# Patient Record
Sex: Female | Born: 1995 | Race: White | Hispanic: No | Marital: Single | Smoking: Never smoker
Health system: Southern US, Community
[De-identification: ages and names within clinical notes are randomized; demographics above are authoritative.]

## PROBLEM LIST (undated history)

## (undated) ENCOUNTER — Inpatient Hospital Stay (HOSPITAL_COMMUNITY): Payer: Self-pay

## (undated) DIAGNOSIS — J45909 Unspecified asthma, uncomplicated: Secondary | ICD-10-CM

## (undated) DIAGNOSIS — E8809 Other disorders of plasma-protein metabolism, not elsewhere classified: Secondary | ICD-10-CM

## (undated) DIAGNOSIS — I471 Supraventricular tachycardia, unspecified: Secondary | ICD-10-CM

## (undated) DIAGNOSIS — G43909 Migraine, unspecified, not intractable, without status migrainosus: Secondary | ICD-10-CM

## (undated) DIAGNOSIS — K589 Irritable bowel syndrome without diarrhea: Secondary | ICD-10-CM

## (undated) DIAGNOSIS — R51 Headache: Secondary | ICD-10-CM

## (undated) DIAGNOSIS — T7840XA Allergy, unspecified, initial encounter: Secondary | ICD-10-CM

## (undated) DIAGNOSIS — K219 Gastro-esophageal reflux disease without esophagitis: Secondary | ICD-10-CM

## (undated) DIAGNOSIS — F32A Depression, unspecified: Secondary | ICD-10-CM

## (undated) DIAGNOSIS — N035 Chronic nephritic syndrome with diffuse mesangiocapillary glomerulonephritis: Secondary | ICD-10-CM

## (undated) DIAGNOSIS — D649 Anemia, unspecified: Secondary | ICD-10-CM

## (undated) DIAGNOSIS — Z9109 Other allergy status, other than to drugs and biological substances: Secondary | ICD-10-CM

## (undated) DIAGNOSIS — F329 Major depressive disorder, single episode, unspecified: Secondary | ICD-10-CM

## (undated) DIAGNOSIS — N055 Unspecified nephritic syndrome with diffuse mesangiocapillary glomerulonephritis: Secondary | ICD-10-CM

## (undated) DIAGNOSIS — K635 Polyp of colon: Secondary | ICD-10-CM

## (undated) DIAGNOSIS — F419 Anxiety disorder, unspecified: Secondary | ICD-10-CM

## (undated) HISTORY — DX: Polyp of colon: K63.5

## (undated) HISTORY — DX: Anemia, unspecified: D64.9

## (undated) HISTORY — DX: Anxiety disorder, unspecified: F41.9

## (undated) HISTORY — PX: TONSILLECTOMY: SUR1361

## (undated) HISTORY — DX: Other disorders of plasma-protein metabolism, not elsewhere classified: E88.09

## (undated) HISTORY — PX: ESOPHAGOGASTRODUODENOSCOPY ENDOSCOPY: SHX5814

## (undated) HISTORY — DX: Unspecified asthma, uncomplicated: J45.909

## (undated) HISTORY — PX: RENAL BIOPSY: SHX156

## (undated) HISTORY — PX: COLONOSCOPY: SHX174

## (undated) HISTORY — DX: Allergy, unspecified, initial encounter: T78.40XA

## (undated) HISTORY — DX: Migraine, unspecified, not intractable, without status migrainosus: G43.909

## (undated) HISTORY — PX: TUBAL LIGATION: SHX77

## (undated) HISTORY — PX: ADENOIDECTOMY: SUR15

## (undated) HISTORY — DX: Supraventricular tachycardia, unspecified: I47.10

---
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
## 2014-06-19 ENCOUNTER — Encounter (HOSPITAL_COMMUNITY): Payer: Self-pay | Admitting: *Deleted

## 2014-08-15 IMAGING — CR DG CHEST 2V
2 series · 2 of 2 positions shown · non-contrast
Comparison: { 12/31/2005}

CHEST - 2 VIEW
CLINICAL DATA: { chest pain }

[w chest pa]
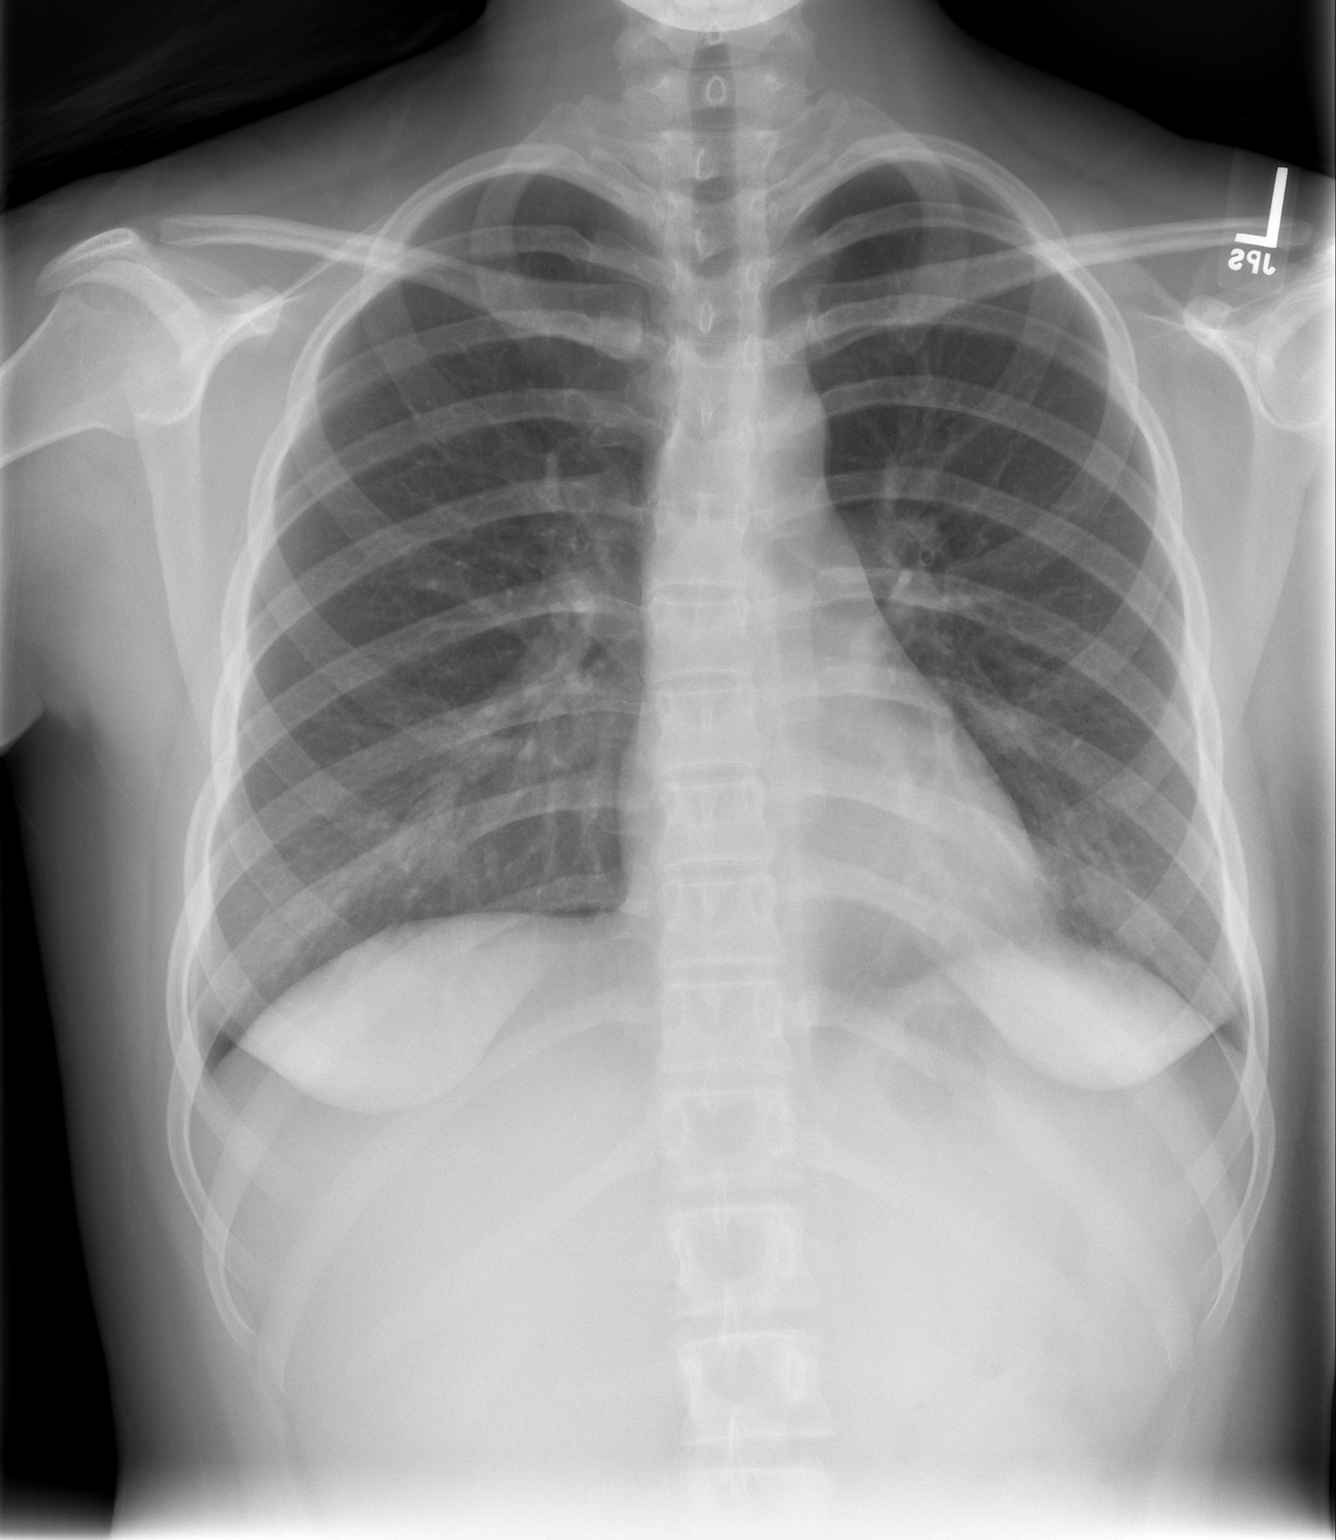

[w chest lat]
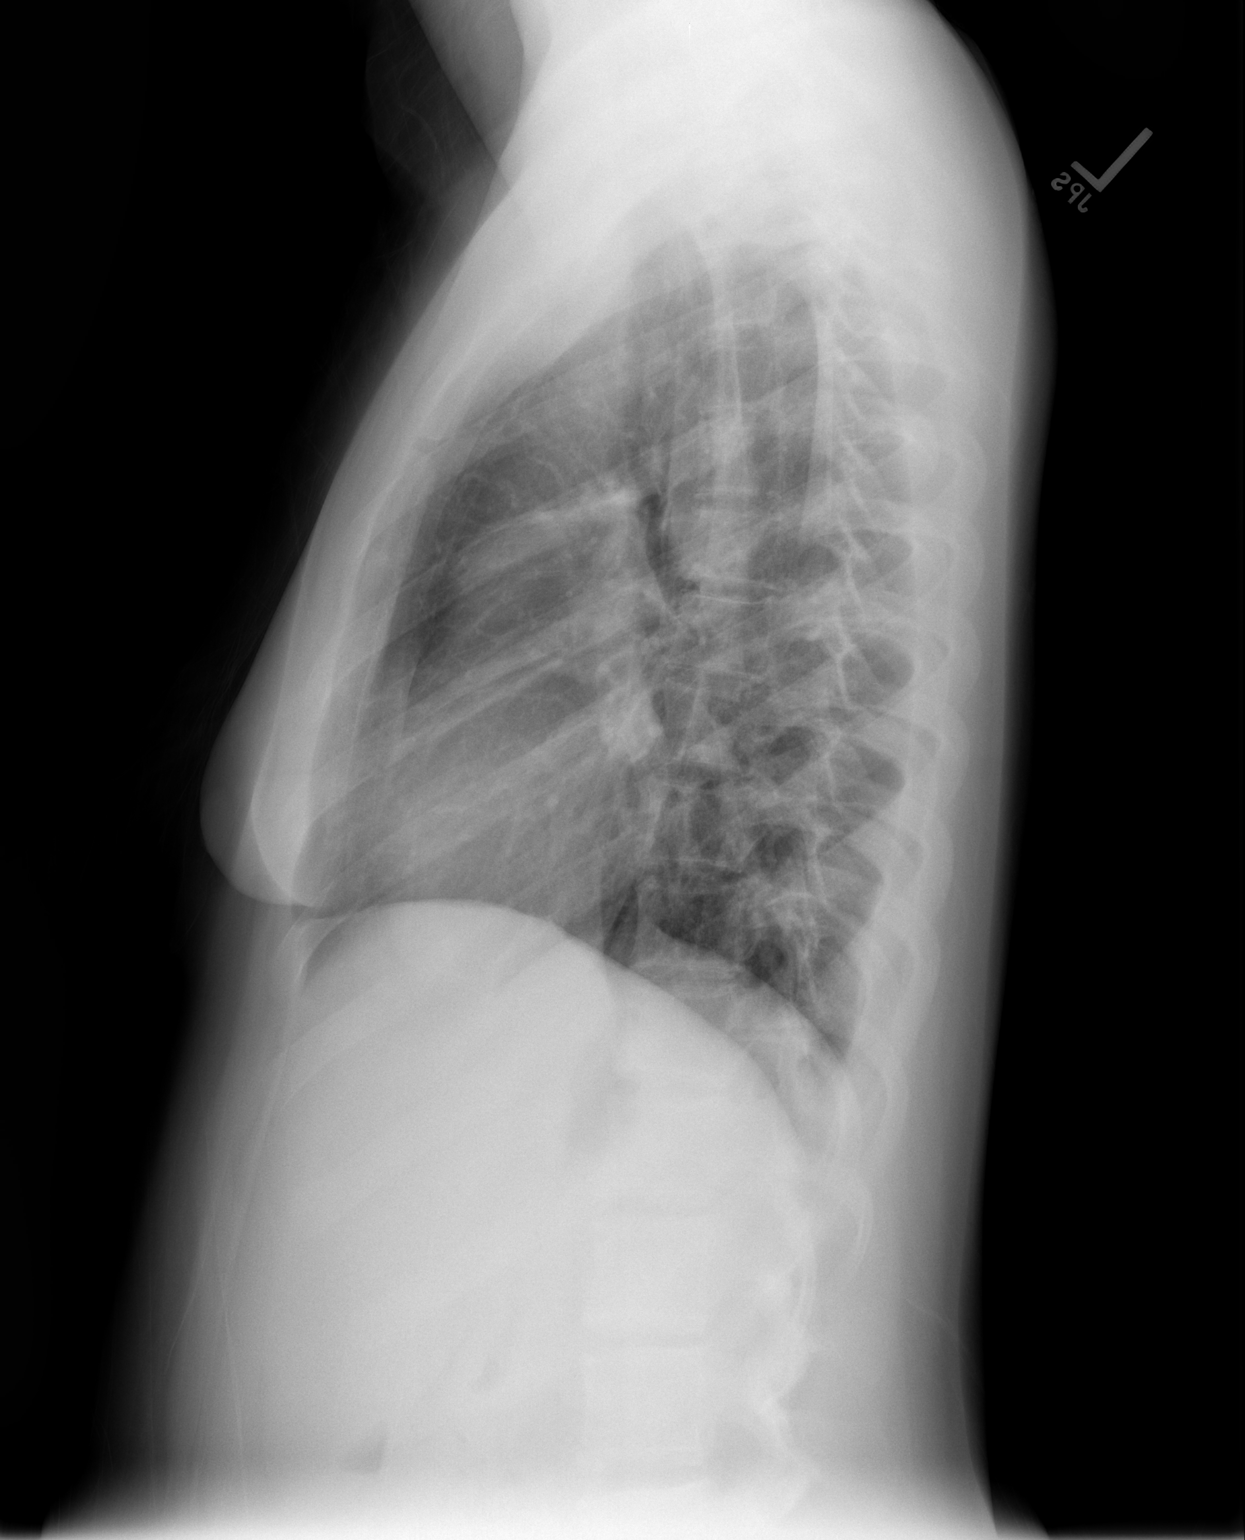

[2 of 2 positions shown; findings below may reference images not displayed]

FINDINGS: The heart, mediastinal, and hilar contours are normal.
The lungs are well-expanded and clear. Negative for pleural
abnormality or pneumothorax. No acute bony abnormality.
IMPRESSION: No acute cardiopulmonary disease.

## 2014-08-18 HISTORY — PX: ENDOMETRIAL ABLATION: SHX621

## 2015-10-02 ENCOUNTER — Encounter (HOSPITAL_COMMUNITY): Payer: Self-pay | Admitting: Emergency Medicine

## 2015-10-02 DIAGNOSIS — Z793 Long term (current) use of hormonal contraceptives: Secondary | ICD-10-CM | POA: Insufficient documentation

## 2015-10-02 DIAGNOSIS — K219 Gastro-esophageal reflux disease without esophagitis: Secondary | ICD-10-CM | POA: Diagnosis not present

## 2015-10-02 DIAGNOSIS — Z880 Allergy status to penicillin: Secondary | ICD-10-CM | POA: Diagnosis not present

## 2015-10-02 DIAGNOSIS — Z3202 Encounter for pregnancy test, result negative: Secondary | ICD-10-CM | POA: Diagnosis not present

## 2015-10-02 DIAGNOSIS — F329 Major depressive disorder, single episode, unspecified: Secondary | ICD-10-CM | POA: Diagnosis not present

## 2015-10-02 DIAGNOSIS — K2970 Gastritis, unspecified, without bleeding: Secondary | ICD-10-CM | POA: Diagnosis not present

## 2015-10-02 DIAGNOSIS — Z79899 Other long term (current) drug therapy: Secondary | ICD-10-CM | POA: Diagnosis not present

## 2015-10-02 DIAGNOSIS — Z87448 Personal history of other diseases of urinary system: Secondary | ICD-10-CM | POA: Insufficient documentation

## 2015-10-02 DIAGNOSIS — K920 Hematemesis: Secondary | ICD-10-CM | POA: Diagnosis present

## 2015-10-02 LAB — CBC
HCT: 39.5 % (ref 36.0–46.0)
HEMOGLOBIN: 12.9 g/dL (ref 12.0–15.0)
MCH: 27.3 pg (ref 26.0–34.0)
MCHC: 32.7 g/dL (ref 30.0–36.0)
MCV: 83.7 fL (ref 78.0–100.0)
Platelets: 253 10*3/uL (ref 150–400)
RBC: 4.72 MIL/uL (ref 3.87–5.11)
RDW: 13.3 % (ref 11.5–15.5)
WBC: 8.7 10*3/uL (ref 4.0–10.5)

## 2015-10-02 LAB — URINALYSIS, ROUTINE W REFLEX MICROSCOPIC
Bilirubin Urine: NEGATIVE
Glucose, UA: NEGATIVE mg/dL
Ketones, ur: NEGATIVE mg/dL
LEUKOCYTES UA: NEGATIVE
Nitrite: NEGATIVE
Protein, ur: >300 mg/dL — AB
SPECIFIC GRAVITY, URINE: 1.023 (ref 1.005–1.030)
pH: 6 (ref 5.0–8.0)

## 2015-10-02 LAB — COMPREHENSIVE METABOLIC PANEL
ALT: 15 U/L (ref 14–54)
ANION GAP: 8 (ref 5–15)
AST: 18 U/L (ref 15–41)
Albumin: 3 g/dL — ABNORMAL LOW (ref 3.5–5.0)
Alkaline Phosphatase: 44 U/L (ref 38–126)
BUN: 11 mg/dL (ref 6–20)
CALCIUM: 8.8 mg/dL — AB (ref 8.9–10.3)
CO2: 24 mmol/L (ref 22–32)
Chloride: 106 mmol/L (ref 101–111)
Creatinine, Ser: 0.57 mg/dL (ref 0.44–1.00)
GFR calc non Af Amer: >60 mL/min (ref >60)
Glucose, Bld: 101 mg/dL — ABNORMAL HIGH (ref 65–99)
Potassium: 3.8 mmol/L (ref 3.5–5.1)
Sodium: 138 mmol/L (ref 135–145)
TOTAL PROTEIN: 5.9 g/dL — AB (ref 6.5–8.1)
Total Bilirubin: 0.2 mg/dL — ABNORMAL LOW (ref 0.3–1.2)

## 2015-10-02 LAB — SAMPLE TO BLOOD BANK

## 2015-10-02 LAB — URINE MICROSCOPIC-ADD ON

## 2015-10-02 LAB — LIPASE, BLOOD: Lipase: 39 U/L (ref 11–51)

## 2015-10-02 LAB — POC URINE PREG, ED: Preg Test, Ur: NEGATIVE

## 2015-10-02 NOTE — ED Notes (Signed)
Pt. reports bloody emesis x1 this afternoon with generalized abdominal pain , denies fever or diarrhea , seen at [facility] urgent care this evening  advised to go to ER for evaluation .

## 2015-10-03 ENCOUNTER — Emergency Department (HOSPITAL_COMMUNITY)
Admission: EM | Admit: 2015-10-03 | Discharge: 2015-10-03 | Disposition: A | Discharge status: Home / Self Care | Payer: Medicaid Other | Attending: Emergency Medicine | Admitting: Emergency Medicine

## 2015-10-03 DIAGNOSIS — R112 Nausea with vomiting, unspecified: Secondary | ICD-10-CM

## 2015-10-03 DIAGNOSIS — K2970 Gastritis, unspecified, without bleeding: Secondary | ICD-10-CM

## 2015-10-03 HISTORY — DX: Irritable bowel syndrome, unspecified: K58.9

## 2015-10-03 HISTORY — DX: Gastro-esophageal reflux disease without esophagitis: K21.9

## 2015-10-03 MED ORDER — ONDANSETRON 4 MG PO TBDP
4.0000 mg | ORAL_TABLET | Freq: Once | ORAL | Status: AC
  Administered 2015-10-03: 4 mg via ORAL
  Filled 2015-10-03: qty 1

## 2015-10-03 MED ORDER — PANTOPRAZOLE SODIUM 40 MG PO TBEC
40.0000 mg | DELAYED_RELEASE_TABLET | Freq: Once | ORAL | Status: AC
  Administered 2015-10-03: 40 mg via ORAL
  Filled 2015-10-03: qty 1

## 2015-10-03 MED ORDER — GI COCKTAIL ~~LOC~~
30.0000 mL | Freq: Once | ORAL | Status: AC
  Administered 2015-10-03: 30 mL via ORAL
  Filled 2015-10-03: qty 30

## 2015-10-03 MED ORDER — SUCRALFATE 1 GM/10ML PO SUSP: 1.0000 g | Freq: Three times a day (TID) | ORAL | Status: DC

## 2015-10-03 MED ORDER — PANTOPRAZOLE SODIUM 40 MG PO TBEC: 40.0000 mg | DELAYED_RELEASE_TABLET | Freq: Every day | ORAL | Status: DC

## 2015-10-03 MED ORDER — ONDANSETRON 4 MG PO TBDP: 4.0000 mg | ORAL_TABLET | Freq: Three times a day (TID) | ORAL | Status: DC | PRN

## 2015-10-03 NOTE — Discharge Instructions (Signed)
Gastritis, Adult Gastritis is soreness and swelling (inflammation) of the lining of the stomach. Gastritis can develop as a sudden onset (acute) or long-term (chronic) condition. If gastritis is not treated, it can lead to stomach bleeding and ulcers. CAUSES  Gastritis occurs when the stomach lining is weak or damaged. Digestive juices from the stomach then inflame the weakened stomach lining. The stomach lining may be weak or damaged due to viral or bacterial infections. One common bacterial infection is the Helicobacter pylori infection. Gastritis can also result from excessive alcohol consumption, taking certain medicines, or having too much acid in the stomach.  SYMPTOMS  In some cases, there are no symptoms. When symptoms are present, they may include:  Pain or a burning sensation in the upper abdomen.  Nausea.  Vomiting.  An uncomfortable feeling of fullness after eating. DIAGNOSIS  Your caregiver may suspect you have gastritis based on your symptoms and a physical exam. To determine the cause of your gastritis, your caregiver may perform the following:  Blood or stool tests to check for the H pylori bacterium.  Gastroscopy. A thin, flexible tube (endoscope) is passed down the esophagus and into the stomach. The endoscope has a light and camera on the end. Your caregiver uses the endoscope to view the inside of the stomach.  Taking a tissue sample (biopsy) from the stomach to examine under a microscope. TREATMENT  Depending on the cause of your gastritis, medicines may be prescribed. If you have a bacterial infection, such as an H pylori infection, antibiotics may be given. If your gastritis is caused by too much acid in the stomach, H2 blockers or antacids may be given. Your caregiver may recommend that you stop taking aspirin, ibuprofen, or other nonsteroidal anti-inflammatory drugs (NSAIDs). HOME CARE INSTRUCTIONS  Only take over-the-counter or prescription medicines as directed by  your caregiver.  If you were given antibiotic medicines, take them as directed. Finish them even if you start to feel better.  Drink enough fluids to keep your urine clear or pale yellow.  Avoid foods and drinks that make your symptoms worse, such as:  Caffeine or alcoholic drinks.  Chocolate.  Peppermint or mint flavorings.  Garlic and onions.  Spicy foods.  Citrus fruits, such as oranges, lemons, or limes.  Tomato-based foods such as sauce, chili, salsa, and pizza.  Fried and fatty foods.  Eat small, frequent meals instead of large meals. SEEK IMMEDIATE MEDICAL CARE IF:   You have black or dark red stools.  You vomit blood or material that looks like coffee grounds.  You are unable to keep fluids down.  Your abdominal pain gets worse.  You have a fever.  You do not feel better after 1 week.  You have any other questions or concerns. MAKE SURE YOU:  Understand these instructions.  Will watch your condition.  Will get help right away if you are not doing well or get worse.   This information is not intended to replace advice given to you by your health care provider. Make sure you discuss any questions you have with your health care provider.   Document Released: 07/29/2001 Document Revised: 02/03/2012 Document Reviewed: 09/17/2011 Elsevier Interactive Patient Education 2016 [employer]. Food Choices for Gastroesophageal Reflux Disease, Adult When you have gastroesophageal reflux disease (GERD), the foods you eat and your eating habits are very important. Choosing the right foods can help ease the discomfort of GERD. WHAT GENERAL GUIDELINES DO I NEED TO FOLLOW?  Choose fruits, vegetables, whole grains,  low-fat dairy products, and low-fat meat, fish, and poultry.  Limit fats such as oils, salad dressings, butter, nuts, and avocado.  Keep a food diary to identify foods that cause symptoms.  Avoid foods that cause reflux. These may be different for  different people.  Eat frequent small meals instead of three large meals each day.  Eat your meals slowly, in a relaxed setting.  Limit fried foods.  Cook foods using methods other than frying.  Avoid drinking alcohol.  Avoid drinking large amounts of liquids with your meals.  Avoid bending over or lying down until 2-3 hours after eating. WHAT FOODS ARE NOT RECOMMENDED? The following are some foods and drinks that may worsen your symptoms: Vegetables Tomatoes. Tomato juice. Tomato and spaghetti sauce. Chili peppers. Onion and garlic. Horseradish. Fruits Oranges, grapefruit, and lemon (fruit and juice). Meats High-fat meats, fish, and poultry. This includes hot dogs, ribs, ham, sausage, salami, and bacon. Dairy Whole milk and chocolate milk. Sour cream. Cream. Butter. Ice cream. Cream cheese.  Beverages Coffee and tea, with or without caffeine. Carbonated beverages or energy drinks. Condiments Hot sauce. Barbecue sauce.  Sweets/Desserts Chocolate and cocoa. Donuts. Peppermint and spearmint. Fats and Oils High-fat foods, including [place] fries and potato chips. Other Vinegar. Strong spices, such as black pepper, white pepper, red pepper, cayenne, curry powder, cloves, ginger, and chili powder. The items listed above may not be a complete list of foods and beverages to avoid. Contact your dietitian for more information.   This information is not intended to replace advice given to you by your health care provider. Make sure you discuss any questions you have with your health care provider.   Document Released: 08/04/2005 Document Revised: 08/25/2014 Document Reviewed: 06/08/2013 Elsevier Interactive Patient Education 2016 Elsevier Inc.  Hematemesis Hematemesis is when you vomit blood. It is a sign of bleeding in the upper part of your digestive tract. This is also called your gastrointestinal (GI) tract. Your upper GI tract includes your mouth, throat, esophagus, stomach, and  the first part of your small intestine (duodenum).  Hematemesis is usually caused by bleeding from your esophagus or stomach. You may suddenly vomit bright red blood. You might also vomit old blood. It may look like coffee grounds. You may also have other symptoms, such as:  Stomach pain.  Heartburn.  Black and tarry stool.  HOME CARE INSTRUCTIONS  Watch your hematemesis for any changes. The following actions may help to lessen any discomfort you are feeling:  Take medicines only as directed by your health care provider. Do not take aspirin, ibuprofen, or any other anti-inflammatory medicine without approval from your health care provider.  Rest as needed.  Drink small sips of clear liquids often, as long as you can keep them down. Try to drink enough fluids to keep your urine clear or pale yellow.  Do not drink alcohol.  Do not use any tobacco products, including cigarettes, chewing tobacco, or electronic cigarettes. If you need help quitting, ask your health care provider.  Keep all follow-up visits as directed by your health care provider. This is important. SEEK MEDICAL CARE IF:   The vomiting of blood worsens, or begins again after it has stopped.  You have persistent stomach pain.  You have nausea, indigestion, or heartburn.  You feel weak or dizzy. SEEK IMMEDIATE MEDICAL CARE IF:   You faint or feel extremely weak.  You have a rapid heartbeat.  You are urinating less than normal or not at all.  You  have persistent vomiting.  You vomit large amounts of bloody or dark material.  You vomit bright red blood.  You pass large, dark, or bloody stools.  You have chest pain or trouble breathing.   This information is not intended to replace advice given to you by your health care provider. Make sure you discuss any questions you have with your health care provider.   Document Released: 09/11/2004 Document Revised: 08/25/2014 Document Reviewed: 03/29/2014 Elsevier  Interactive Patient Education [employer]

## 2015-10-03 NOTE — ED Provider Notes (Signed)
By signing my name below, I, Soijett Blue, attest that this documentation has been prepared under the direction and in the presence of [employer], DO. Electronically Signed: Soijett Blue, ED Scribe. 10/03/2015. 1:11 AM.  TIME SEEN: 1:02 AM   CHIEF COMPLAINT: Hematemesis  HPI: Jennifer Rangel is a 20 y.o. female with a medical hx of GERD, IBS, MPGN who presents to the Emergency Department complaining of hematemesis x 3 episodes onset 4 PM yesterday. She notes that she was seen at [facility] urgent care yesterday PTA for her symptoms and was informed to come into the ED for further evaluation. She reports that her emesis taste like blood and "acid" and it is foamy and red in appearance. Pt reports that she was seen at a hospital in [place], [place] and had a CT scan completed of her abdomen to which there was "swelling of her small intestine" which they contributed to her IBS. Pt has an appointment at a GI specialist in [place] with 10/09/15 for follow up of her symptoms. Pt PCP is Dr. Audie Box, whom she states is unaware of her current symptoms.  Pt is having associated symptoms of generalized abdominal pain. She notes that she has not tried any medications for the relief of her symptoms. She denies fever, diarrhea, black tarry stools, blood in stools, and any other symptoms. No coffee-ground emesis. Denies taking blood thinners, NSAIDs, or alcohol use. Pt last had a colonoscopy x 2 months ago with a polyp found and she was dx with IBS. Pt had an endoscopy several years ago and was dx with GERD.    ROS: See HPI Constitutional: no fever  Eyes: no drainage  ENT: no runny nose   Cardiovascular:  no chest pain  Resp: no SOB  GI:  vomiting GU: no dysuria Integumentary: no rash  Allergy: no hives  Musculoskeletal: no leg swelling  Neurological: no slurred speech ROS otherwise negative  PAST MEDICAL HISTORY/PAST SURGICAL HISTORY:  Past Medical History  Diagnosis Date  . Headache(784.0)    . Glomerulonephritis, chronic membranoproliferative   . Environmental allergies   . MPGN (membranoproliferative glomerulonephritides)   . Depression   . IBS (irritable bowel syndrome)   . GERD (gastroesophageal reflux disease)     MEDICATIONS:  Prior to Admission medications   Medication Sig Start Date End Date Taking? Authorizing Provider  acetaminophen (TYLENOL) 325 MG tablet Take 2 tablets (650 mg total) by mouth every 6 (six) hours as needed for pain. 05/05/13   Marcellina Millin, MD  ARIPiprazole (ABILIFY) 5 MG tablet Take 1 tablet (5 mg total) by mouth daily. 05/02/13   Jolene Schimke, NP  cetirizine (ZYRTEC) 10 MG tablet Take 1 tablet (10 mg total) by mouth daily. Patient may resume home supply. 05/02/13   Jolene Schimke, NP  escitalopram (LEXAPRO) 20 MG tablet Take 1 tablet (20 mg total) by mouth at bedtime. 05/02/13   Jolene Schimke, NP  guanFACINE (INTUNIV) 1 MG TB24 Take 1 tablet (1 mg total) by mouth at bedtime. 05/02/13   Jolene Schimke, NP  Levonorgestrel-Ethinyl Estradiol (AMETHIA,CAMRESE) 0.15-0.03 &0.01 MG tablet Take 1 tablet by mouth daily. Patient may resume home supply.  Nursing, please insure that home medications are returned to the patient at discharge. 05/02/13   Jolene Schimke, NP  ramipril (ALTACE) 2.5 MG capsule Take 1 capsule (2.5 mg total) by mouth daily. Patient may resume home supply. 05/02/13   Jolene Schimke, NP  ranitidine (ZANTAC) 150 MG capsule Take  1 capsule (150 mg total) by mouth 2 (two) times daily. Patient may resume home supply. 05/02/13   Jolene Schimke, NP    ALLERGIES:  Allergies  Allergen Reactions  . Imitrex [Sumatriptan] Nausea And Vomiting  . Amoxicillin Rash  . Doxycycline Rash  . Maxolon [Metoclopramide] Rash  . Penicillins Rash  . Tape Rash    SOCIAL HISTORY:  Social History  Substance Use Topics  . Smoking status: Never Smoker   . Smokeless tobacco: Never Used  . Alcohol Use: No    FAMILY HISTORY: Family History  Problem Relation Age of Onset   . Alcohol abuse Father   . Drug abuse Sister     EXAM: BP 116/92 mmHg  Pulse 92  Temp(Src) 97.9 F (36.6 C) (Oral)  Resp 18  Ht  (1.651 m)  Wt 174 lb (78.926 kg)  BMI 28.96 kg/m2  SpO2 100%  LMP 09/18/2015 (Approximate) CONSTITUTIONAL: Alert and oriented and responds appropriately to questions. Well-appearing; well-nourished HEAD: Normocephalic EYES: Conjunctivae clear, PERRL ENT: normal nose; no rhinorrhea; moist mucous membranes; pharynx without lesions noted NECK: Supple, no meningismus, no LAD  CARD: RRR; S1 and S2 appreciated; no murmurs, no clicks, no rubs, no gallops RESP: Normal chest excursion without splinting or tachypnea; breath sounds clear and equal bilaterally; no wheezes, no rhonchi, no rales, no hypoxia or respiratory distress, speaking full sentences ABD/GI: Normal bowel sounds; non-distended; soft, nontender to palpation throughout the entire abdomen, no rebound, no guarding, no peritoneal signs RECTAL:  Normal rectal tone, no gross blood or melena, no hemorrhoids appreciated, nontender rectal exam BACK:  The back appears normal and is non-tender to palpation, there is no CVA tenderness EXT: Normal ROM in all joints; non-tender to palpation; no edema; normal capillary refill; no cyanosis, no calf tenderness or swelling    SKIN: Normal color for age and race; warm NEURO: Moves all extremities equally, sensation to light touch intact diffusely, cranial nerves II through XII intact PSYCH: The patient's mood and manner are appropriate. Grooming and personal hygiene are appropriate.  MEDICAL DECISION MAKING: Patient here with 3 episodes of emesis that she states were red in appearance. No coffee-ground emesis, clots, melena or hematochezia. Diffuse abdominal tenderness. Has a history of GERD. I suspect this may be gastritis, esophagitis, peptic ulcer. Doubt perforated ulcer given her relatively benign abdominal exam. Labs are unremarkable. Urine is unremarkable.  We'll treat symptomatically with Protonix, GI cocktail, Zofran and fluid challenge patient. She has an appointment to follow-up with her gastroenterologist in 6 days.  ED PROGRESS: 2:00 AM  Pt reports feeling much better. Abdominal exam still benign. She has been able to drink without difficulty and has not had any further vomiting. Suspect again gastritis, GERD. I do not she needs emergent endoscopy. She has follow-up with her gastroenterologist and 6 days. Have advised her to avoid NSAIDs, alcohol. Will discharge on Protonix, Carafate and Zofran. Discussed strict return precautions. Patient verbalizes understanding and is comfortable with this plan.      I personally performed the services described in this documentation, which was scribed in my presence. The recorded information has been reviewed and is accurate.    Layla Maw Taurean Ju, DO 10/03/15 0202

## 2016-03-28 ENCOUNTER — Encounter (HOSPITAL_COMMUNITY): Payer: Self-pay

## 2016-03-28 ENCOUNTER — Emergency Department (HOSPITAL_COMMUNITY)
Admission: EM | Admit: 2016-03-28 | Discharge: 2016-03-29 | Disposition: A | Discharge status: Home / Self Care | Payer: Medicaid Other | Attending: Emergency Medicine | Admitting: Emergency Medicine

## 2016-03-28 DIAGNOSIS — R102 Pelvic and perineal pain: Secondary | ICD-10-CM | POA: Insufficient documentation

## 2016-03-28 LAB — CBC
HCT: 39.8 % (ref 36.0–46.0)
Hemoglobin: 13 g/dL (ref 12.0–15.0)
MCH: 27.7 pg (ref 26.0–34.0)
MCHC: 32.7 g/dL (ref 30.0–36.0)
MCV: 84.9 fL (ref 78.0–100.0)
PLATELETS: 323 10*3/uL (ref 150–400)
RBC: 4.69 MIL/uL (ref 3.87–5.11)
RDW: 12.4 % (ref 11.5–15.5)
WBC: 7.5 10*3/uL (ref 4.0–10.5)

## 2016-03-28 LAB — URINALYSIS, ROUTINE W REFLEX MICROSCOPIC
BILIRUBIN URINE: NEGATIVE
Glucose, UA: NEGATIVE mg/dL
KETONES UR: NEGATIVE mg/dL
NITRITE: NEGATIVE
Specific Gravity, Urine: 1.03 (ref 1.005–1.030)
pH: 6 (ref 5.0–8.0)

## 2016-03-28 LAB — BASIC METABOLIC PANEL
Anion gap: 6 (ref 5–15)
BUN: 11 mg/dL (ref 6–20)
CALCIUM: 9.2 mg/dL (ref 8.9–10.3)
CO2: 26 mmol/L (ref 22–32)
CREATININE: 0.5 mg/dL (ref 0.44–1.00)
Chloride: 106 mmol/L (ref 101–111)
Glucose, Bld: 126 mg/dL — ABNORMAL HIGH (ref 65–99)
Potassium: 3.3 mmol/L — ABNORMAL LOW (ref 3.5–5.1)
SODIUM: 138 mmol/L (ref 135–145)

## 2016-03-28 LAB — URINE MICROSCOPIC-ADD ON

## 2016-03-28 NOTE — ED Triage Notes (Signed)
Pt complaining of pelvic pain x 2 days. Pt states history of kidney disease. Pt complaining bilateral flank pain. Pt states recent endomitriosis sx ~ 2 weeks ago.

## 2016-03-29 LAB — PREGNANCY, URINE: Preg Test, Ur: NEGATIVE

## 2016-03-29 LAB — WET PREP, GENITAL
Clue Cells Wet Prep HPF POC: NONE SEEN
Sperm: NONE SEEN
TRICH WET PREP: NONE SEEN
YEAST WET PREP: NONE SEEN

## 2016-03-29 MED ORDER — ACETAMINOPHEN 325 MG PO TABS: 650.0000 mg | ORAL_TABLET | Freq: Once | ORAL | Status: DC

## 2016-03-29 NOTE — Discharge Instructions (Signed)
Read the information below.   Your urine was not infected and no infection noted on your vaginal samples. Gonorrhea and chlamydia still pending. Your other labs show slightly low potassium, be sure to eat potassium rich foods such as sweet potatoes, squash, and bananas.  You declined a pelvic US today. I encourage you to call your OB/GYN on Monday to let them know that you are seen in the emergency department for pelvic pain. You can take Tylenol 650 mg every 6 hours for pain control. You may return to the Emergency Department at any time for worsening condition or any new symptoms that concern you. Return to the emergency department if her symptoms worsen or you develop new symptoms such as fever, vomiting, blood in stool, localized abdominal pain such as the right lower abdomen, inability to keep food or fluids down.

## 2016-03-29 NOTE — ED Provider Notes (Signed)
[facility]-EMERGENCY DEPT Provider Note   CSN: [id number] Arrival date & time: 03/28/16  4098  First Provider Contact:  None       History   Chief Complaint Chief Complaint  Patient presents with  . Pelvic Pain  . Flank Pain    HPI Jennifer Rangel is a 20 y.o. female.  Jennifer Rangel is a 19 y.o. Female G2P1101 with history of GERD, MPGN type I, IBS, depression, endometriosis presents to ED with complaint of pelvic pain. Patient states pelvic pain started approximately 2 days ago and is constant, sharp in nature. She does endorse associated suprapubic pain. She denies vaginal bleeding, vaginal pain, vaginal discharge. She denies dysuria. 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 No fever, nausea, vomiting, diarrhea, constipation, blood in stool. No chest pain or shortness of breath. No trauma to the abdomen. She is sexually active with 1 female in the last 6 months. She denies any barrier contraceptive. Patient states she was recently diagnosed with endometriosis via laparoscopy and is wondering if this could be the cause of her pain. No other history of gynecologic conditions or procedures. No other abdominal procedures      Past Medical History:  Diagnosis Date  . Depression   . Environmental allergies   . GERD (gastroesophageal reflux disease)   . Glomerulonephritis, chronic membranoproliferative   . Headache(784.0)   . IBS (irritable bowel syndrome)   . MPGN (membranoproliferative glomerulonephritides)     Patient Active Problem List   Diagnosis Date Noted  . PTSD (post-traumatic stress disorder) 03/24/2013  . 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 ODD (oppositional defiant disorder) 03/21/2013    Past Surgical History:  Procedure Laterality Date  . ADENOIDECTOMY    . COLONOSCOPY    . ESOPHAGOGASTRODUODENOSCOPY ENDOSCOPY    . RENAL BIOPSY    . TONSILLECTOMY      OB History    Gravida Para  Term Preterm AB Living   1             SAB TAB Ectopic Multiple Live Births                   Home Medications    Prior to Admission medications   Medication Sig Start Date End Date Taking? Authorizing Provider  losartan (COZAAR) 25 MG tablet Take 25 mg by mouth 2 (two) times daily.    Yes Historical Provider, MD  pantoprazole (PROTONIX) 40 MG tablet Take 1 tablet (40 mg total) by mouth daily. 10/03/15  Yes Kristen N Ward, DO  sucralfate (CARAFATE) 1 GM/10ML suspension Take 10 mLs (1 g total) by mouth 4 (four) times daily -  with meals and at bedtime. 10/03/15  Yes Kristen N Ward, DO  ondansetron (ZOFRAN ODT) 4 MG disintegrating tablet Take 1 tablet (4 mg total) by mouth every 8 (eight) hours as needed for nausea or vomiting. Patient not taking: Reported on 03/28/2016 10/03/15   Layla Maw Ward, DO    Family History Family History  Problem Relation Age of Onset  . Alcohol abuse Father   . Drug abuse Sister     Social History Social History  Substance Use Topics  . Smoking status: Never Smoker  . Smokeless tobacco: Never Used  . Alcohol use No     Allergies   Bactrim [sulfamethoxazole-trimethoprim]; Imitrex [sumatriptan]; Amoxicillin; Doxycycline; Maxolon [metoclopramide]; Penicillins; and Tape   Review of Systems Review of Systems  Gastrointestinal: Positive for abdominal pain.  Genitourinary: Positive for pelvic pain.  All other systems reviewed and are negative.    Physical Exam Updated Vital Signs BP 134/98 (BP Location: Left Arm)   Pulse 89   Temp 98 F (36.7 C)   Resp 18   Ht 5\' 5"  (1.651 m)   Wt 76.7 kg   LMP 03/16/2016 (Approximate)   BMI 28.14 kg/m   Physical Exam  Constitutional: She appears well-developed and well-nourished. No distress.  HENT:  Head: Normocephalic and atraumatic.  Mouth/Throat: Oropharynx is clear and moist. No oropharyngeal exudate.  Eyes: Conjunctivae and EOM are normal. Pupils are equal, round, and reactive to light. Right eye  exhibits no discharge. Left eye exhibits no discharge. No scleral icterus.  Neck: Normal range of motion. Neck supple.  Cardiovascular: Normal rate, regular rhythm, normal heart sounds and intact distal pulses.   No murmur heard. Pulmonary/Chest: Effort normal and breath sounds normal. No respiratory distress.  Abdominal: Soft. Bowel sounds are normal. She exhibits no distension. There is no tenderness. There is no rebound and no guarding.  Genitourinary:  Genitourinary Comments: Chaperone present for duration of exam. External anatomy normal - no injury, lesions, masses, or rashes. No bleeding, lesions, masses, or ulcerations in vaginal cavity. Cervix is closed with white discharge. No friability. No CMT, tenderness to palpation on bimanual, no masses palpated.   Musculoskeletal: Normal range of motion.  Lymphadenopathy:    She has no cervical adenopathy.  Neurological: She is alert. Coordination normal.  Skin: Skin is warm and dry. She is not diaphoretic.  Psychiatric: She has a normal mood and affect.     ED Treatments / Results  Labs (all labs ordered are listed, but only abnormal results are displayed) Labs Reviewed  WET PREP, GENITAL - Abnormal; Notable for the following:       Result Value   WBC, Wet Prep HPF POC MANY (*)    All other components within normal limits  URINALYSIS, ROUTINE W REFLEX MICROSCOPIC (NOT AT [facility]) - Abnormal; Notable for the following:    APPearance HAZY (*)    Hgb urine dipstick MODERATE (*)    Protein, ur >300 (*)    Leukocytes, UA SMALL (*)    All other components within normal limits  BASIC METABOLIC PANEL - Abnormal; Notable for the following:    Potassium 3.3 (*)    Glucose, Bld 126 (*)    All other components within normal limits  URINE MICROSCOPIC-ADD ON - Abnormal; Notable for the following:    Squamous Epithelial / LPF 6-30 (*)    Bacteria, UA FEW (*)    Casts HYALINE CASTS (*)    All other components within normal limits  URINE CULTURE   CBC  PREGNANCY, URINE  GC/CHLAMYDIA PROBE AMP ([place]) NOT AT [facility]    EKG  EKG Interpretation None       Radiology No results found.  Procedures Procedures (including critical care time)  Medications Ordered in ED Medications - No data to display   Initial Impression / Assessment and Plan / ED Course  I have reviewed the triage vital signs and the nursing notes.  Pertinent labs & imaging results that were available during my care of the patient were reviewed by me and considered in my medical decision making (see chart for details).  Clinical Course    Patient is afebrile and nontoxic-appearing in NAD. She is resting comfortably in bed. Her vital signs are stable. Benign abdominal exam. Wet prep shows mild white discharge in vaginal  cavity. No bleeding noted. Cervix is closed. Mild tenderness to palpation during bimanual exam. No masses palpated. No CMT.  BMP remarkable for mild hypokalemia, discussed with patient potassium rich foods. Mild elevation in blood glucose with no evidence of AG. CBC is reassuring. Pregnancy test is negative-low suspicion for ectopic. Moderate hemoglobin and proteinuria noted on urinalysis. Per patient this is normal given her MPGN. Some leukocytes noted, however screen was epithelials also present, patient is asx. Given history of kidney condition, will culture urine. Low suspicion for acute abdominal/pelvic pathology.  Discussed results with patient. Suspect symptoms may be related to endometriosis. Discussed the option of completing a pelvic ultrasound at this time for further evaluation. Patient politely declined, stating she would prefer to have her OB/GYN perform any further evaluation. Discussed with patient symptomatic treatment. Strict return return precautions provided. Patient voiced understanding and is agreeable.   Final Clinical Impressions(s) / ED Diagnoses   Final diagnoses:  Pelvic pain in female    New  Prescriptions Discharge Medication List as of 03/29/2016  2:26 AM       Lona KettleAshley Laurel Danetta Prom, PA-C 03/29/16 1016    Tomasita CrumbleAdeleke Oni, MD 03/29/16 1346

## 2016-03-29 NOTE — ED Notes (Signed)
Pt d/c home with d/c instructions 

## 2016-03-30 LAB — URINE CULTURE

## 2016-03-31 LAB — GC/CHLAMYDIA PROBE AMP (~~LOC~~) NOT AT ARMC
CHLAMYDIA, DNA PROBE: NEGATIVE
Neisseria Gonorrhea: NEGATIVE

## 2017-08-12 ENCOUNTER — Encounter (HOSPITAL_COMMUNITY): Payer: Self-pay

## 2017-08-12 ENCOUNTER — Inpatient Hospital Stay (HOSPITAL_COMMUNITY)
Admission: AD | Admit: 2017-08-12 | Discharge: 2017-08-13 | Disposition: A | Discharge status: Home / Self Care | Payer: Medicaid Other | Source: Ambulatory Visit | Attending: Obstetrics & Gynecology | Admitting: Obstetrics & Gynecology

## 2017-08-12 DIAGNOSIS — K589 Irritable bowel syndrome without diarrhea: Secondary | ICD-10-CM | POA: Insufficient documentation

## 2017-08-12 DIAGNOSIS — Z3A01 Less than 8 weeks gestation of pregnancy: Secondary | ICD-10-CM | POA: Diagnosis not present

## 2017-08-12 DIAGNOSIS — Z880 Allergy status to penicillin: Secondary | ICD-10-CM | POA: Diagnosis not present

## 2017-08-12 DIAGNOSIS — O99611 Diseases of the digestive system complicating pregnancy, first trimester: Secondary | ICD-10-CM | POA: Insufficient documentation

## 2017-08-12 DIAGNOSIS — Z79899 Other long term (current) drug therapy: Secondary | ICD-10-CM | POA: Diagnosis not present

## 2017-08-12 DIAGNOSIS — R109 Unspecified abdominal pain: Secondary | ICD-10-CM | POA: Diagnosis present

## 2017-08-12 DIAGNOSIS — Z881 Allergy status to other antibiotic agents status: Secondary | ICD-10-CM | POA: Diagnosis not present

## 2017-08-12 DIAGNOSIS — N898 Other specified noninflammatory disorders of vagina: Secondary | ICD-10-CM

## 2017-08-12 DIAGNOSIS — R809 Proteinuria, unspecified: Secondary | ICD-10-CM | POA: Insufficient documentation

## 2017-08-12 DIAGNOSIS — Z888 Allergy status to other drugs, medicaments and biological substances status: Secondary | ICD-10-CM | POA: Insufficient documentation

## 2017-08-12 DIAGNOSIS — Z9889 Other specified postprocedural states: Secondary | ICD-10-CM | POA: Diagnosis not present

## 2017-08-12 DIAGNOSIS — Z882 Allergy status to sulfonamides status: Secondary | ICD-10-CM | POA: Diagnosis not present

## 2017-08-12 DIAGNOSIS — O26851 Spotting complicating pregnancy, first trimester: Secondary | ICD-10-CM

## 2017-08-12 DIAGNOSIS — R801 Persistent proteinuria, unspecified: Secondary | ICD-10-CM

## 2017-08-12 DIAGNOSIS — O26831 Pregnancy related renal disease, first trimester: Secondary | ICD-10-CM | POA: Diagnosis not present

## 2017-08-12 DIAGNOSIS — O26891 Other specified pregnancy related conditions, first trimester: Secondary | ICD-10-CM | POA: Diagnosis not present

## 2017-08-12 DIAGNOSIS — K219 Gastro-esophageal reflux disease without esophagitis: Secondary | ICD-10-CM | POA: Insufficient documentation

## 2017-08-12 DIAGNOSIS — N289 Disorder of kidney and ureter, unspecified: Secondary | ICD-10-CM | POA: Insufficient documentation

## 2017-08-12 DIAGNOSIS — Z91048 Other nonmedicinal substance allergy status: Secondary | ICD-10-CM | POA: Diagnosis not present

## 2017-08-12 DIAGNOSIS — Z3490 Encounter for supervision of normal pregnancy, unspecified, unspecified trimester: Secondary | ICD-10-CM

## 2017-08-12 LAB — URINALYSIS, ROUTINE W REFLEX MICROSCOPIC
BILIRUBIN URINE: NEGATIVE
Glucose, UA: NEGATIVE mg/dL
KETONES UR: NEGATIVE mg/dL
NITRITE: NEGATIVE
Specific Gravity, Urine: 1.023 (ref 1.005–1.030)
pH: 5 (ref 5.0–8.0)

## 2017-08-12 LAB — CBC
HCT: 37.3 % (ref 36.0–46.0)
Hemoglobin: 12.6 g/dL (ref 12.0–15.0)
MCH: 29 pg (ref 26.0–34.0)
MCHC: 33.8 g/dL (ref 30.0–36.0)
MCV: 85.7 fL (ref 78.0–100.0)
PLATELETS: 260 10*3/uL (ref 150–400)
RBC: 4.35 MIL/uL (ref 3.87–5.11)
RDW: 12.5 % (ref 11.5–15.5)
WBC: 9.7 10*3/uL (ref 4.0–10.5)

## 2017-08-12 LAB — POCT PREGNANCY, URINE: PREG TEST UR: POSITIVE — AB

## 2017-08-12 NOTE — MAU Note (Signed)
Pt c/o spotting when wiping that started this morning. Pt states she is having lower abdominal pain. Pt states she took Tylenol-last took this morning-did not help. LMP: 07/08/2017. Had a upt at home. Pregnancy [facility] by [facility].

## 2017-08-13 ENCOUNTER — Inpatient Hospital Stay (HOSPITAL_COMMUNITY): Payer: Medicaid Other

## 2017-08-13 DIAGNOSIS — R109 Unspecified abdominal pain: Secondary | ICD-10-CM

## 2017-08-13 DIAGNOSIS — N898 Other specified noninflammatory disorders of vagina: Secondary | ICD-10-CM

## 2017-08-13 DIAGNOSIS — O26891 Other specified pregnancy related conditions, first trimester: Secondary | ICD-10-CM | POA: Diagnosis not present

## 2017-08-13 LAB — HCG, QUANTITATIVE, PREGNANCY: HCG, BETA CHAIN, QUANT, S: 70263 m[IU]/mL — AB (ref <5)

## 2017-08-13 LAB — WET PREP, GENITAL
Clue Cells Wet Prep HPF POC: NONE SEEN
SPERM: NONE SEEN
TRICH WET PREP: NONE SEEN
YEAST WET PREP: NONE SEEN

## 2017-08-13 LAB — GC/CHLAMYDIA PROBE AMP (~~LOC~~) NOT AT ARMC
Chlamydia: POSITIVE — AB
Neisseria Gonorrhea: NEGATIVE

## 2017-08-13 NOTE — Discharge Instructions (Signed)
Nothing concerning on labs or imaging today; no news is good news Establish OB care Follow-up with your kidney doctor   First Trimester of Pregnancy The first trimester of pregnancy is from week 1 until the end of week 13 (months 1 through 3). During this time, your baby will begin to develop inside you. At 6-8 weeks, the eyes and face are formed, and the heartbeat can be seen on ultrasound. At the end of 12 weeks, all the baby's organs are formed. Prenatal care is all the medical care you receive before the birth of your baby. Make sure you get good prenatal care and follow all of your doctor's instructions. Follow these instructions at home: Medicines  Take over-the-counter and prescription medicines only as told by your doctor. Some medicines are safe and some medicines are not safe during pregnancy.  Take a prenatal vitamin that contains at least 600 micrograms (mcg) of folic acid.  If you have trouble pooping (constipation), take medicine that will make your stool soft (stool softener) if your doctor approves. Eating and drinking  Eat regular, healthy meals.  Your doctor will tell you the amount of weight gain that is right for you.  Avoid raw meat and uncooked cheese.  If you feel sick to your stomach (nauseous) or throw up (vomit): ? Eat 4 or 5 small meals a day instead of 3 large meals. ? Try eating a few soda crackers. ? Drink liquids between meals instead of during meals.  To prevent constipation: ? Eat foods that are high in fiber, like fresh fruits and vegetables, whole grains, and beans. ? Drink enough fluids to keep your pee (urine) clear or pale yellow. Activity  Exercise only as told by your doctor. Stop exercising if you have cramps or pain in your lower belly (abdomen) or low back.  Do not exercise if it is too hot, too humid, or if you are in a place of great height (high altitude).  Try to avoid standing for long periods of time. Move your legs often if you  must stand in one place for a long time.  Avoid heavy lifting.  Wear low-heeled shoes. Sit and stand up straight.  You can have sex unless your doctor tells you not to. Relieving pain and discomfort  Wear a good support bra if your breasts are sore.  Take warm water baths (sitz baths) to soothe pain or discomfort caused by hemorrhoids. Use hemorrhoid cream if your doctor says it is okay.  Rest with your legs raised if you have leg cramps or low back pain.  If you have puffy, bulging veins (varicose veins) in your legs: ? Wear support hose or compression stockings as told by your doctor. ? Raise (elevate) your feet for 15 minutes, 3-4 times a day. ? Limit salt in your food. Prenatal care  Schedule your prenatal visits by the twelfth week of pregnancy.  Write down your questions. Take them to your prenatal visits.  Keep all your prenatal visits as told by your doctor. This is important. Safety  Wear your seat belt at all times when driving.  Make a list of emergency phone numbers. The list should include numbers for family, friends, the hospital, and police and fire departments. General instructions  Ask your doctor for a referral to a local prenatal class. Begin classes no later than at the start of month 6 of your pregnancy.  Ask for help if you need counseling or if you need help with nutrition. Your  doctor can give you advice or tell you where to go for help.  Do not use hot tubs, steam rooms, or saunas.  Do not douche or use tampons or scented sanitary pads.  Do not cross your legs for long periods of time.  Avoid all herbs and alcohol. Avoid drugs that are not approved by your doctor.  Do not use any tobacco products, including cigarettes, chewing tobacco, and electronic cigarettes. If you need help quitting, ask your doctor. You may get counseling or other support to help you quit.  Avoid cat litter boxes and soil used by cats. These carry germs that can cause  birth defects in the baby and can cause a loss of your baby (miscarriage) or stillbirth.  Visit your dentist. At home, brush your teeth with a soft toothbrush. Be gentle when you floss. Contact a doctor if:  You are dizzy.  You have mild cramps or pressure in your lower belly.  You have a nagging pain in your belly area.  You continue to feel sick to your stomach, you throw up, or you have watery poop (diarrhea).  You have a bad smelling fluid coming from your vagina.  You have pain when you pee (urinate).  You have increased puffiness (swelling) in your face, hands, legs, or ankles. Get help right away if:  You have a fever.  You are leaking fluid from your vagina.  You have spotting or bleeding from your vagina.  You have very bad belly cramping or pain.  You gain or lose weight rapidly.  You throw up blood. It may look like coffee grounds.  You are around people who have [place] measles, fifth disease, or chickenpox.  You have a very bad headache.  You have shortness of breath.  You have any kind of trauma, such as from a fall or a car accident. Summary  The first trimester of pregnancy is from week 1 until the end of week 13 (months 1 through 3).  To take care of yourself and your unborn baby, you will need to eat healthy meals, take medicines only if your doctor tells you to do so, and do activities that are safe for you and your baby.  Keep all follow-up visits as told by your doctor. This is important as your doctor will have to ensure that your baby is healthy and growing well. This information is not intended to replace advice given to you by your health care provider. Make sure you discuss any questions you have with your health care provider. Document Released: 01/21/2008 Document Revised: 08/12/2016 Document Reviewed: 08/12/2016 Elsevier Interactive Patient Education  2017 [employer]

## 2017-08-13 NOTE — MAU Provider Note (Signed)
Chief Complaint: Possible Pregnancy; Vaginal Bleeding; and Abdominal Pain   SUBJECTIVE HPI: Jennifer Rangel is a 21 y.o. G3P0011 at 2884w1d by LMP who presents to MAU with concern for vaginal spotting and abdominal cramping. States that symptoms started this afternoon. Tried Tylenol without relief. Only blood noticed when she wiped. Has noticed some increased discharge that is a thick white and odorous. Endorsing some irritating features to discharge. H/o STDs and bacterial infections. Last had intercourse 2 days ago. Is concerned because she had a miscarriage about 3 months ago.  Has history of kidney disease. Has not established OB care. Denies fevers, n/v, diarrhea.    Past Medical History:  Diagnosis Date  . Depression   . Environmental allergies   . GERD (gastroesophageal reflux disease)   . Glomerulonephritis, chronic membranoproliferative   . Headache(784.0)   . IBS (irritable bowel syndrome)   . MPGN (membranoproliferative glomerulonephritides)    OB History  Gravida Para Term Preterm AB Living  3 1     1 1   SAB TAB Ectopic Multiple Live Births  1            # Outcome Date GA Lbr Len/2nd Weight Sex Delivery Anes PTL Lv  3 Current           2 SAB           1 Para              Past Surgical History:  Procedure Laterality Date  . ADENOIDECTOMY    . COLONOSCOPY    . ESOPHAGOGASTRODUODENOSCOPY ENDOSCOPY    . RENAL BIOPSY    . TONSILLECTOMY     Social History   Socioeconomic History  . Marital status: Single    Spouse name: Not on file  . Number of children: Not on file  . Years of education: Not on file  . Highest education level: Not on file  Social Needs  . Financial resource strain: Not on file  . Food insecurity - worry: Not on file  . Food insecurity - inability: Not on file  . Transportation needs - medical: Not on file  . Transportation needs - non-medical: Not on file  Occupational History  . Not on file  Tobacco Use  . Smoking status: Never Smoker  .  Smokeless tobacco: Never Used  Substance and Sexual Activity  . Alcohol use: No  . Drug use: No  . Sexual activity: Yes    Birth control/protection: None  Other Topics Concern  . Not on file  Social History Narrative  . Not on file   No current facility-administered medications on file prior to encounter.    Current Outpatient Medications on File Prior to Encounter  Medication Sig Dispense Refill  . losartan (COZAAR) 25 MG tablet Take 25 mg by mouth 2 (two) times daily.     . ondansetron (ZOFRAN ODT) 4 MG disintegrating tablet Take 1 tablet (4 mg total) by mouth every 8 (eight) hours as needed for nausea or vomiting. 20 tablet 0  . pantoprazole (PROTONIX) 40 MG tablet Take 1 tablet (40 mg total) by mouth daily. 30 tablet 1  . sucralfate (CARAFATE) 1 GM/10ML suspension Take 10 mLs (1 g total) by mouth 4 (four) times daily -  with meals and at bedtime. 420 mL 0  . [DISCONTINUED] omeprazole (PRILOSEC) 20 MG capsule Take 20 mg by mouth daily.     Allergies  Allergen Reactions  . Bactrim [Sulfamethoxazole-Trimethoprim]     nausea  . Imitrex [  Sumatriptan] Nausea And Vomiting  . Amoxicillin Rash  . Doxycycline Rash  . Maxolon [Metoclopramide] Rash  . Penicillins Rash  . Tape Rash    I have reviewed the past Medical Hx, Surgical Hx, Social Hx, Allergies and Medications.   REVIEW OF SYSTEMS All systems reviewed and are negative for acute change except as noted in the HPI.   OBJECTIVE BP 105/78 (BP Location: Right Arm)   Pulse (!) 109   Temp 98.2 F (36.8 C) (Oral)   Resp 17   Ht 5\' 5"  (1.651 m)   Wt 76.2 kg (168 lb)   LMP 07/08/2017   SpO2 99%   BMI 27.96 kg/m    PHYSICAL EXAM Constitutional: Well-developed, well-nourished female in no acute distress.  Cardiovascular: normal rate and rhythm, pulses intact Respiratory: normal rate and effort.  GI: Abd soft, non-tender, non-distended.  MS: Extremities nontender, no edema, normal ROM Neurologic: Alert and oriented x 4.  No focal deficits SPECULUM EXAM: NEFG, increased discharge, no blood noted, cervix clean BIMANUAL: cervix closed; uterus normal size, no adnexal tenderness or masses. No CMT. Psych: normal mood and affect  LAB RESULTS Results for orders placed or performed during the hospital encounter of 08/12/17 (from the past 24 hour(s))  Urinalysis, Routine w reflex microscopic     Status: Abnormal   Collection Time: 08/12/17 10:05 PM  Result Value Ref Range   Color, Urine AMBER (A) YELLOW   APPearance TURBID (A) CLEAR   Specific Gravity, Urine 1.023 1.005 - 1.030   pH 5.0 5.0 - 8.0   Glucose, UA NEGATIVE NEGATIVE mg/dL   Hgb urine dipstick SMALL (A) NEGATIVE   Bilirubin Urine NEGATIVE NEGATIVE   Ketones, ur NEGATIVE NEGATIVE mg/dL   Protein, ur >=161 (A) NEGATIVE mg/dL   Nitrite NEGATIVE NEGATIVE   Leukocytes, UA MODERATE (A) NEGATIVE   RBC / HPF TOO NUMEROUS TO COUNT 0 - 5 RBC/hpf   WBC, UA TOO NUMEROUS TO COUNT 0 - 5 WBC/hpf   Bacteria, UA FEW (A) NONE SEEN   Squamous Epithelial / LPF TOO NUMEROUS TO COUNT (A) NONE SEEN   Mucus PRESENT   Pregnancy, urine POC     Status: Abnormal   Collection Time: 08/12/17 10:18 PM  Result Value Ref Range   Preg Test, Ur POSITIVE (A) NEGATIVE  CBC     Status: None   Collection Time: 08/12/17 11:44 PM  Result Value Ref Range   WBC 9.7 4.0 - 10.5 K/uL   RBC 4.35 3.87 - 5.11 MIL/uL   Hemoglobin 12.6 12.0 - 15.0 g/dL   HCT 09.6 04.5 - 40.9 %   MCV 85.7 78.0 - 100.0 fL   MCH 29.0 26.0 - 34.0 pg   MCHC 33.8 30.0 - 36.0 g/dL   RDW 81.1 91.4 - 78.2 %   Platelets 260 150 - 400 K/uL  hCG, quantitative, pregnancy     Status: Abnormal   Collection Time: 08/12/17 11:44 PM  Result Value Ref Range   hCG, Beta Chain, Quant, S 70,263 (H) <5 mIU/mL  Wet prep, genital     Status: Abnormal   Collection Time: 08/13/17 12:25 AM  Result Value Ref Range   Yeast Wet Prep HPF POC NONE SEEN NONE SEEN   Trich, Wet Prep NONE SEEN NONE SEEN   Clue Cells Wet Prep HPF POC  NONE SEEN NONE SEEN   WBC, Wet Prep HPF POC MANY (A) NONE SEEN   Sperm NONE SEEN     IMAGING US Ob Comp Less  14 Wks  Result Date: 08/13/2017 CLINICAL DATA:  21 y/o  F; 2 days of abdominal pain. EXAM: OBSTETRIC <14 WK US AND TRANSVAGINAL OB US TECHNIQUE: Both transabdominal and transvaginal ultrasound examinations were performed for complete evaluation of the gestation as well as the maternal uterus, adnexal regions, and pelvic cul-de-sac. Transvaginal technique was performed to assess early pregnancy. COMPARISON:  None. FINDINGS: Intrauterine gestational sac: Single Yolk sac:  Visualized. Embryo:  Visualized. Cardiac Activity: Visualized. Heart Rate: 118  bpm CRL:  4.3  mm   6 w   1 d                  US EDC: 04/07/2018 Subchorionic hemorrhage:  None visualized. Maternal uterus/adnexae: Left ovary corpus luteum. Otherwise negative. IMPRESSION: Single live intrauterine pregnancy with estimated gestational age of [redacted] weeks and 1 day. Electronically Signed   By: Mitzi HansenLance  Furusawa-Stratton M.D.   On: 08/13/2017 01:36   [place] Ob Transvaginal  Result Date: 08/13/2017 CLINICAL DATA:  21 y/o  F; 2 days of abdominal pain. EXAM: OBSTETRIC <14 WK US AND TRANSVAGINAL OB US TECHNIQUE: Both transabdominal and transvaginal ultrasound examinations were performed for complete evaluation of the gestation as well as the maternal uterus, adnexal regions, and pelvic cul-de-sac. Transvaginal technique was performed to assess early pregnancy. COMPARISON:  None. FINDINGS: Intrauterine gestational sac: Single Yolk sac:  Visualized. Embryo:  Visualized. Cardiac Activity: Visualized. Heart Rate: 118  bpm CRL:  4.3  mm   6 w   1 d                  US EDC: 04/07/2018 Subchorionic hemorrhage:  None visualized. Maternal uterus/adnexae: Left ovary corpus luteum. Otherwise negative. IMPRESSION: Single live intrauterine pregnancy with estimated gestational age of [redacted] weeks and 1 day. Electronically Signed   By: Mitzi HansenLance  Furusawa-Stratton M.D.    On: 08/13/2017 01:36    MAU COURSE Vitals and nursing notes reviewed I have ordered labs/imaging and reviewed them Wet prep normal IUP confirmed by US and based off labs Cultures collected UA with proteinuria - chronic in patient with kidney disease Treatments given in MAU: None  MDM Plan of care reviewed with patient, including labs and tests ordered and medical treatment.   ASSESSMENT 1. Vaginal discharge   2. Abdominal pain during pregnancy in first trimester   3. Spotting affecting pregnancy in first trimester   4. Abdominal cramping   5. Persistent proteinuria   6. Intrauterine pregnancy     PLAN Discharge home in stable condition. Reassurance given Counseled on return precautions Establish OB care Follow-up with nephrologist Handout given   Caryl AdaJazma Phelps, DO OB Fellow Faculty Practice, [facility] 08/13/2017, 12:07 AM

## 2018-07-07 ENCOUNTER — Encounter (HOSPITAL_COMMUNITY): Payer: Self-pay

## 2019-01-12 IMAGING — US US OB COMP LESS 14 WK
1 series · 15 of 28 positions shown · non-contrast
Comparison: None.

CLINICAL DATA: 21 y/o  F; 2 days of abdominal pain.

EXAM:
OBSTETRIC <14 WK US AND TRANSVAGINAL OB US
TECHNIQUE: Both transabdominal and transvaginal ultrasound examinations were
performed for complete evaluation of the gestation as well as the
maternal uterus, adnexal regions, and pelvic cul-de-sac.
Transvaginal technique was performed to assess early pregnancy.

[Series 1: us ob comp less 14 wk · 51 acquisitions, 15 frames shown]
[im 1/51]
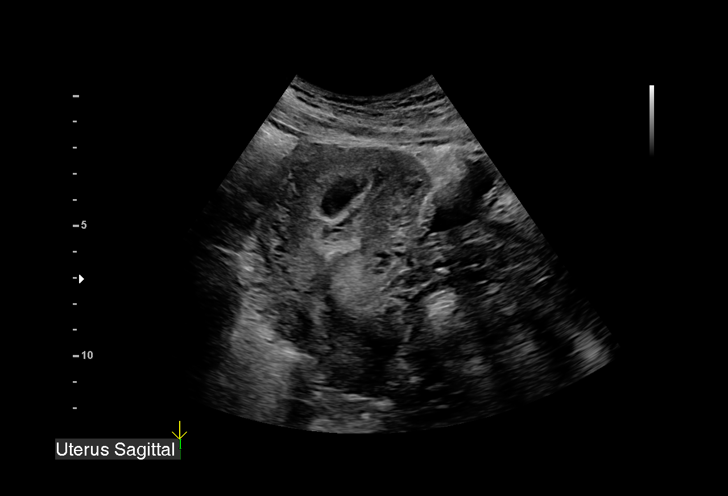
[im 4/51]
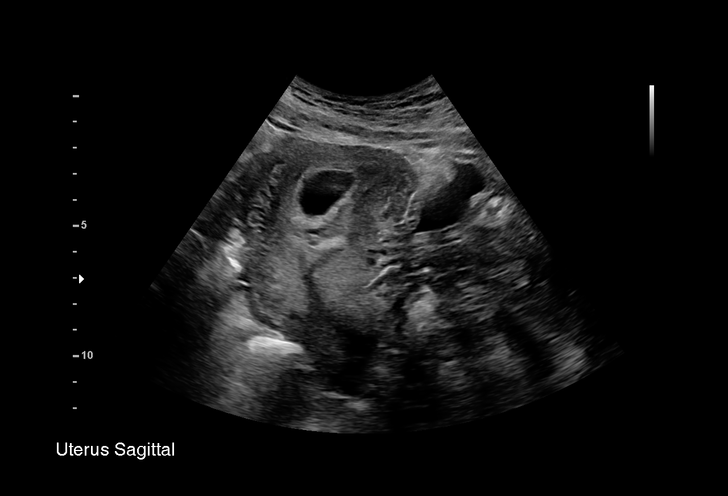
[im 8/51]
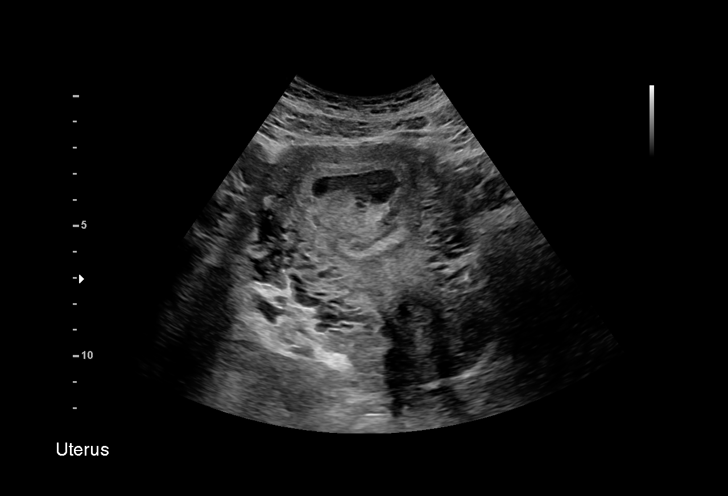
[im 12/51]
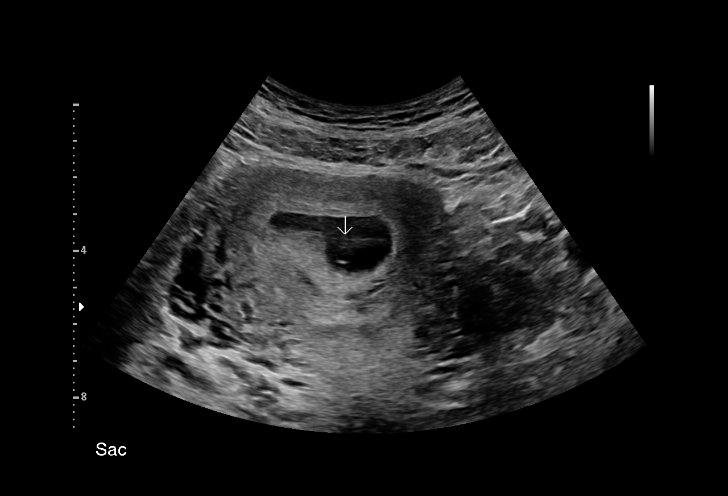
[im 15/51]
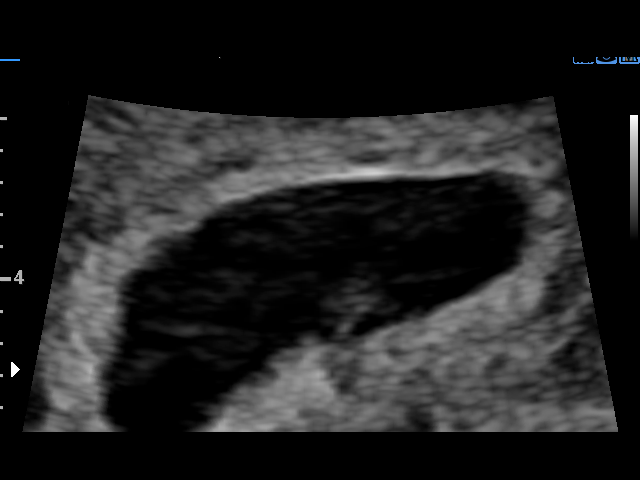
[im 19/51]
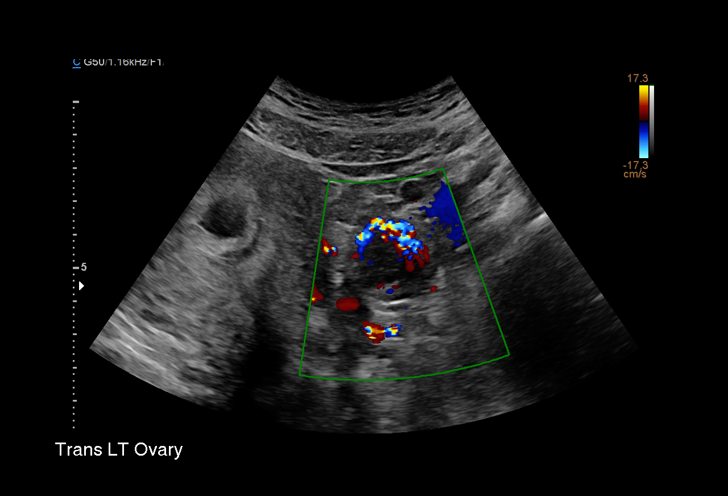
[im 23/51]
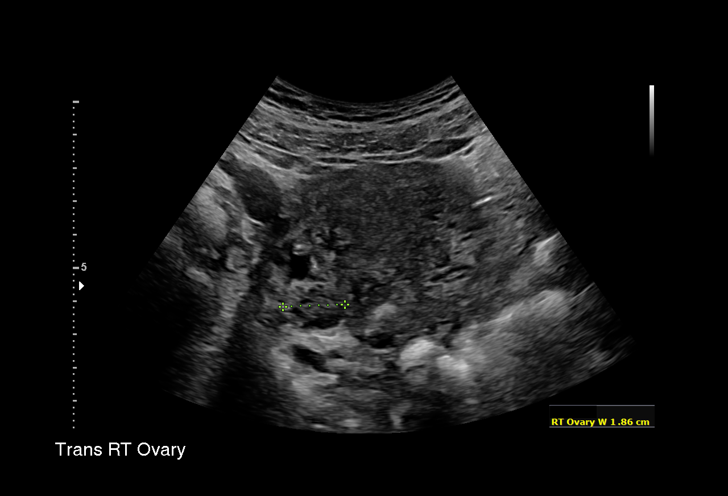
[im 26/51]
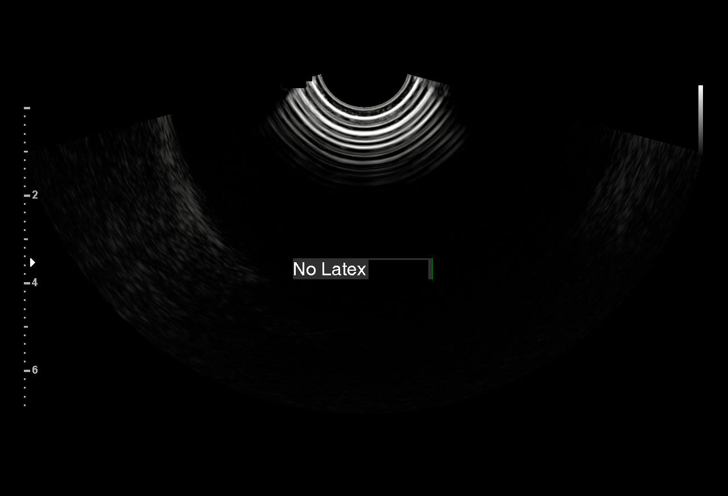
[im 28/51]
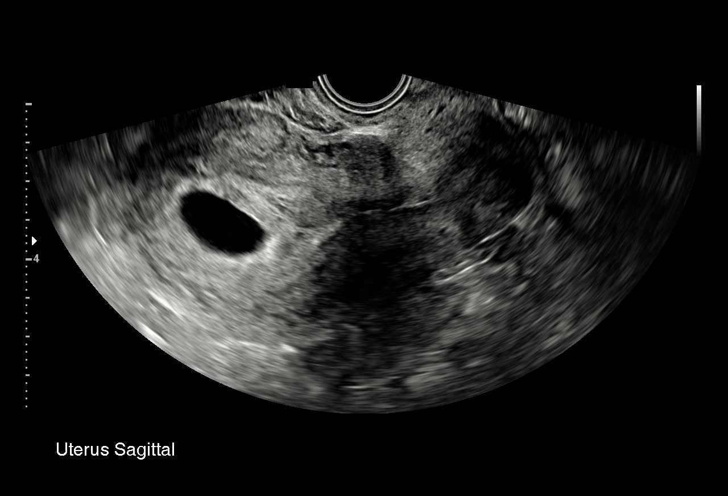
[im 32/51]
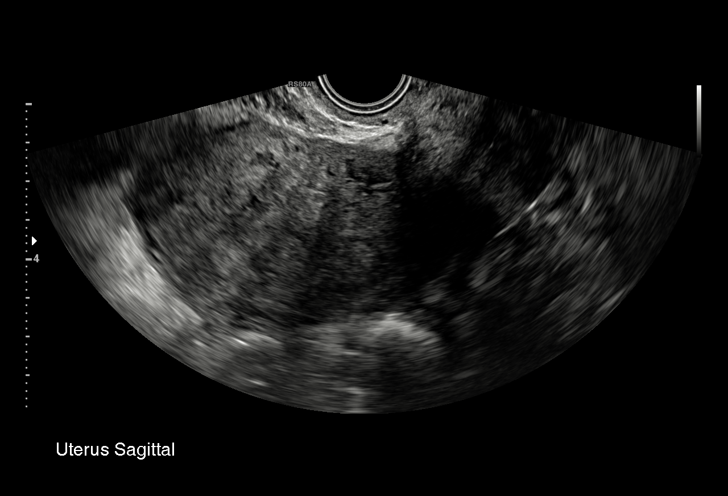
[im 36/51]
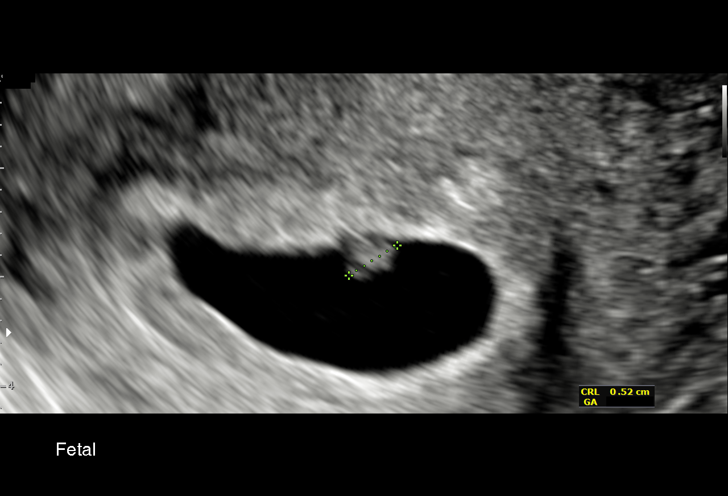
[im 39/51]
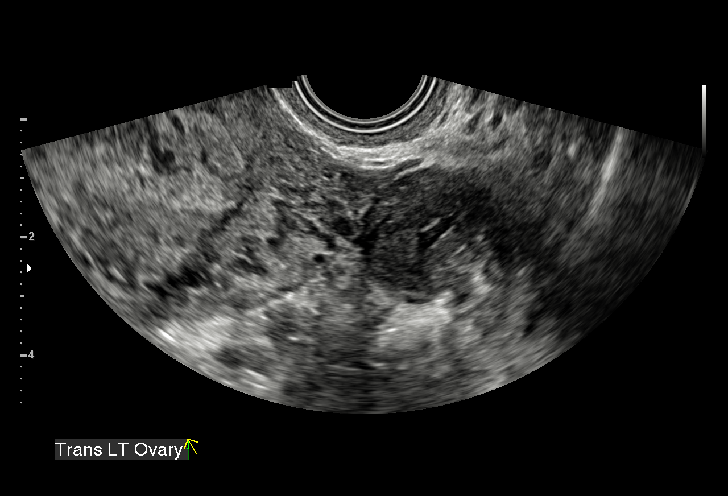
[im 43/51]
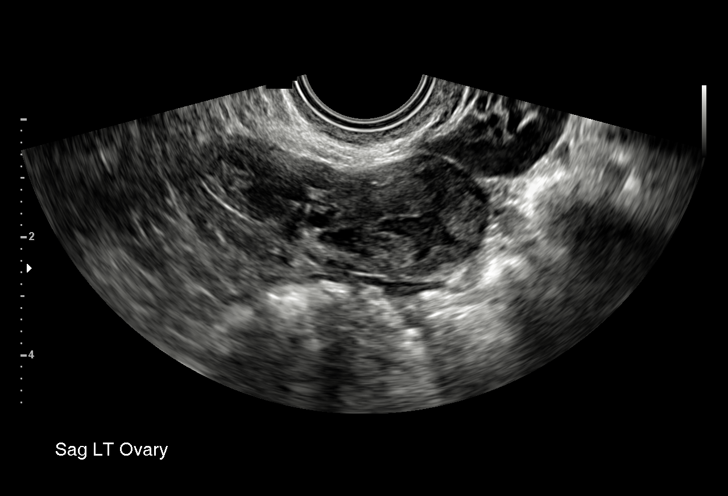
[im 47/51]
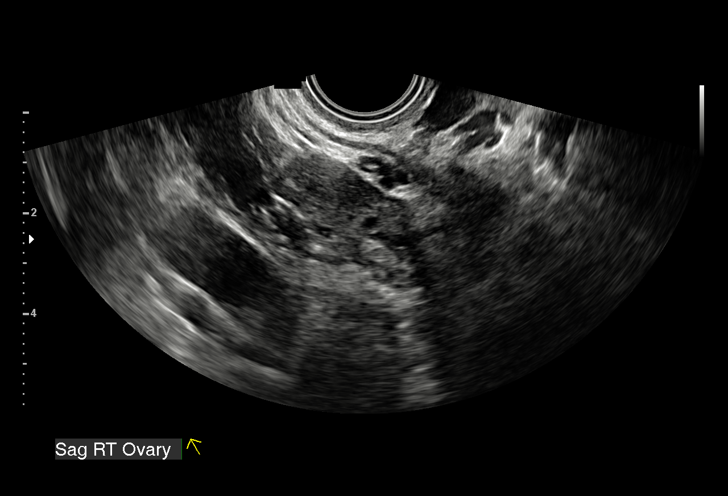
[im 51/51]
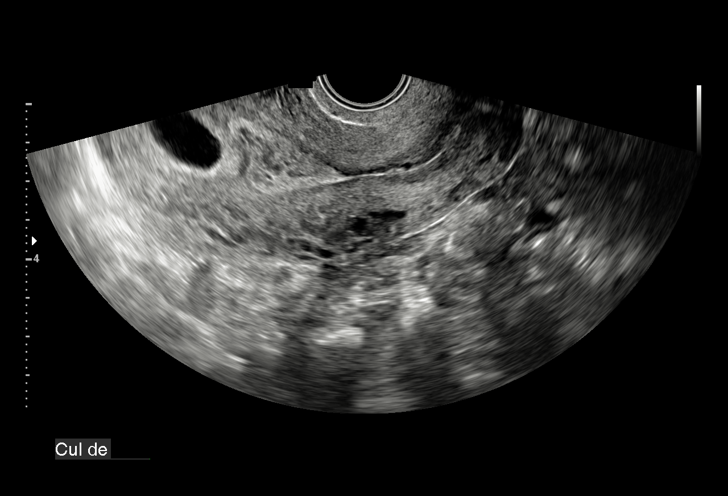

[15 of 28 positions shown; findings below may reference images not displayed]

FINDINGS: Intrauterine gestational sac: Single

Yolk sac:  Visualized.

Embryo:  Visualized.

Cardiac Activity: Visualized.

Heart Rate: 118  bpm

CRL:  4.3  mm   6 w   1 d                  US EDC: 04/07/2018

Subchorionic hemorrhage:  None visualized.

Maternal uterus/adnexae: Left ovary corpus luteum. Otherwise
negative.
IMPRESSION: Single live intrauterine pregnancy with estimated gestational age of
6 weeks and 1 day.

By: Fizza Frame M.D.

## 2023-05-26 ENCOUNTER — Encounter: Payer: Self-pay | Admitting: *Deleted

## 2023-05-27 ENCOUNTER — Encounter: Payer: Self-pay | Admitting: Neurology

## 2023-05-27 ENCOUNTER — Ambulatory Visit: Payer: Medicaid Other | Admitting: Neurology

## 2023-05-27 VITALS — BP 120/79 | HR 110 | Ht 65.0 in | Wt 209.4 lb

## 2023-05-27 DIAGNOSIS — H539 Unspecified visual disturbance: Secondary | ICD-10-CM

## 2023-05-27 DIAGNOSIS — G43711 Chronic migraine without aura, intractable, with status migrainosus: Secondary | ICD-10-CM | POA: Diagnosis not present

## 2023-05-27 DIAGNOSIS — R51 Headache with orthostatic component, not elsewhere classified: Secondary | ICD-10-CM

## 2023-05-27 DIAGNOSIS — R519 Headache, unspecified: Secondary | ICD-10-CM

## 2023-05-27 MED ORDER — TOPIRAMATE 50 MG PO TABS
ORAL_TABLET | ORAL | 6 refills | Status: DC
Start: 2023-05-27 — End: 2024-06-09

## 2023-05-27 MED ORDER — GALCANEZUMAB-GNLM 120 MG/ML ~~LOC~~ SOAJ
240.0000 mg | Freq: Once | SUBCUTANEOUS | Status: AC
Start: 2023-05-27 — End: ?

## 2023-05-27 NOTE — Progress Notes (Signed)
Instructed pt and husband on injecting emgality.  Emgality 120mg  /ML given L upper posterior tolerated fairly well.  Got dizzy, and felt like was going vomit.  She was ok to proceed with other 120mg  injection to R lower out quad.  She said was very anxious and cried out.  After about 5 min, she was ok to leave.  Bandaids applied.  Instructed only one injection there after, approx around 06-27-2023.  Husband may give to pt.  They bother verbalized understanding of how to give, take out 30 min prior to injection to get to room temperature. Discard in hard laundry container, taped lid and to trash.  They appreciated information.  Walked to check out.  No problems.

## 2023-05-27 NOTE — Patient Instructions (Addendum)
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 Prescribe Emgality for prevention  management.  Can still use the ubrelvy.as needed  Galcanezumab Injection What is this medication? GALCANEZUMAB (gal ka NEZ ue mab) prevents migraines. It works by blocking a substance in the body that causes migraines. It may also be used to treat cluster headaches. It is a monoclonal antibody. This medicine may be used for other purposes; ask your health care provider or pharmacist if you have questions. COMMON BRAND NAME(S): Emgality What should I tell my care team before I take this medication? They need to know if you have any of these conditions: An unusual or allergic reaction to galcanezumab, other medications, foods, dyes, or preservatives Pregnant or trying to get pregnant Breast-feeding How should I use this medication? This medication is injected under the skin. You will be taught how to prepare and give it. Take it as directed on the prescription label. Keep taking it unless your care team tells you to stop. It is important that you put your used needles and syringes in a special sharps container. Do not put them in a trash can. If you do not have a sharps container, call your pharmacist or care team to get one. Talk to your care team about the use of this medication in children. Special care may be needed. Overdosage: If you think you have taken too much of this medicine contact a poison control center or emergency room at once. NOTE: This medicine is only for you. Do not share this medicine with others. What if I miss a dose? If you miss a dose, take it as soon as you can. If it is almost time for your next dose, take only that dose. Do not take double or extra doses. What may interact with this medication? Interactions are not expected. This list may not describe all possible interactions. Give your  health care provider a list of all the medicines, herbs, non-prescription drugs, or dietary supplements you use. Also tell them if you smoke, drink alcohol, or use illegal drugs. Some items may interact with your medicine. What should I watch for while using this medication? Visit your care team for regular checks on your progress. Tell your care team if your symptoms do not start to get better or if they get worse. What side effects may I notice from receiving this medication? Side effects that you should report to your care team as soon as possible: Allergic reactions or angioedema--skin rash, itching or hives, swelling of the face, eyes, lips, tongue, arms, or legs, trouble swallowing or breathing Side effects that usually do not require medical attention (report to your care team if they continue or are bothersome): Pain, redness, or irritation at injection site This list may not describe all possible side effects. Call your doctor for medical advice about side effects. You may report side effects to FDA at 1-800-FDA-1088. Where should I keep my medication? Keep out of the reach of children and pets. Store in a refrigerator or at room temperature between 20 and 25 degrees C (68 and 77 degrees F). Refrigeration (preferred): Store in the refrigerator. Do not freeze. Keep in the original container until you are ready to take it. Remove the dose from the carton about 30 minutes before it is time for you to use it. If the dose is not used, it may be stored in original container at room  temperature for 7 days. Get rid of any unused medication after the expiration date. Room Temperature: This medication may be stored at room temperature for up to 7 days. Keep it in the original container. Protect from light until time of use. If it is stored at room temperature, get rid of any unused medication after 7 days or after it expires, whichever is first. To get rid of medications that are no longer needed or  have expired: Take the medication to a medication take-back program. Check with your pharmacy or law enforcement to find a location. If you cannot return the medication, ask your pharmacist or care team how to get rid of this medication safely. NOTE: This sheet is a summary. It may not cover all possible information. If you have questions about this medicine, talk to your doctor, pharmacist, or health care provider.  2024 Elsevier/Gold Standard (2021-09-30 00:00:00) Vernell Barrier Injection What is this medication? FREMANEZUMAB (fre ma NEZ ue mab) prevents migraines. It works by blocking a substance in the body that causes migraines. It is a monoclonal antibody. This medicine may be used for other purposes; ask your health care provider or pharmacist if you have questions. COMMON BRAND NAME(S): AJOVY What should I tell my care team before I take this medication? They need to know if you have any of these conditions: An unusual or allergic reaction to fremanezumab, other medications, foods, dyes, or preservatives Pregnant or trying to get pregnant Breast-feeding How should I use this medication? This medication is injected under the skin. You will be taught how to prepare and give it. Take it as directed on the prescription label. Keep taking it unless your care team tells you to stop. It is important that you put your used needles and syringes in a special sharps container. Do not put them in a trash can. If you do not have a sharps container, call your pharmacist or care team to get one. Talk to your care team about the use of this medication in children. Special care may be needed. Overdosage: If you think you have taken too much of this medicine contact a poison control center or emergency room at once. NOTE: This medicine is only for you. Do not share this medicine with others. What if I miss a dose? If you miss a dose, take it as soon as you can. If it is almost time for your next dose,  take only that dose. Do not take double or extra doses. What may interact with this medication? Interactions are not expected. This list may not describe all possible interactions. Give your health care provider a list of all the medicines, herbs, non-prescription drugs, or dietary supplements you use. Also tell them if you smoke, drink alcohol, or use illegal drugs. Some items may interact with your medicine. What should I watch for while using this medication? Tell your care team if your symptoms do not start to get better or if they get worse. What side effects may I notice from receiving this medication? Side effects that you should report to your care team as soon as possible: Allergic reactions or angioedema--skin rash, itching or hives, swelling of the face, eyes, lips, tongue, arms, or legs, trouble swallowing or breathing Side effects that usually do not require medical attention (report to your care team if they continue or are bothersome): Pain, redness, or irritation at injection site This list may not describe all possible side effects. Call your doctor for medical advice about side effects.  You may report side effects to FDA at 1-800-FDA-1088. Where should I keep my medication? Keep out of the reach of children and pets. Store in a refrigerator or at room temperature between 20 and 25 degrees C (68 and 77 degrees F). Refrigeration (preferred): Store in the refrigerator. Do not freeze. Keep in the original container until you are ready to take it. Remove the dose from the carton about 30 minutes before it is time for you to use it. If the dose is not used, it may be stored in the original container at room temperature for 7 days. Get rid of any unused medication after the expiration date. Room Temperature: This medication may be stored at room temperature for up to 7 days. Keep it in the original container. Protect from light until time of use. If it is stored at room temperature, get  rid of any unused medication after 7 days or after it expires, whichever is first. To get rid of medications that are no longer needed or have expired: Take the medication to a medication take-back program. Check with your pharmacy or law enforcement to find a location. If you cannot return the medication, ask your pharmacist or care team how to get rid of this medication safely. NOTE: This sheet is a summary. It may not cover all possible information. If you have questions about this medicine, talk to your doctor, pharmacist, or health care provider.  2024 Elsevier/Gold Standard (2021-09-27 00:00:00) Topiramate Tablets What is this medication? TOPIRAMATE (toe PYRE a mate) prevents and controls seizures in people with epilepsy. It may also be used to prevent migraine headaches. It works by calming overactive nerves in your body. This medicine may be used for other purposes; ask your health care provider or pharmacist if you have questions. COMMON BRAND NAME(S): Topamax, Topiragen What should I tell my care team before I take this medication? They need to know if you have any of these conditions: Bleeding disorder Kidney disease Lung disease Suicidal thoughts, plans, or attempt by you or a family member An unusual or allergic reaction to topiramate, other medications, foods, dyes, or preservatives Pregnant or trying to get pregnant Breast-feeding How should I use this medication? Take this medication by mouth with water. Take it as directed on the prescription label at the same time every day. Do not cut, crush or chew this medicine. Swallow the tablets whole. You can take it with or without food. If it upsets your stomach, take it with food. Keep taking it unless your care team tells you to stop. A special MedGuide will be given to you by the pharmacist with each prescription and refill. Be sure to read this information carefully each time. Talk to your care team about the use of this  medication in children. While it may be prescribed for children as young as 2 years for selected conditions, precautions do apply. Overdosage: If you think you have taken too much of this medicine contact a poison control center or emergency room at once. NOTE: This medicine is only for you. Do not share this medicine with others. What if I miss a dose? If you miss a dose, take it as soon as you can unless it is within 6 hours of the next dose. If it is within 6 hours of the next dose, skip the missed dose. Take the next dose at the normal time. Do not take double or extra doses. What may interact with this medication? Acetazolamide Alcohol Antihistamines for allergy,  cough, and cold Aspirin and aspirin-like medications Atropine Certain medications for anxiety or sleep Certain medications for bladder problems, such as oxybutynin, tolterodine Certain medications for depression, such as amitriptyline, fluoxetine, sertraline Certain medications for Parkinson disease, such as benztropine, trihexyphenidyl Certain medications for seizures, such as carbamazepine, lamotrigine, phenobarbital, phenytoin, primidone, valproic acid, zonisamide Certain medications for stomach problems, such as dicyclomine, hyoscyamine Certain medications for travel sickness, such as scopolamine Certain medications that treat or prevent blood clots, such as warfarin, enoxaparin, dalteparin, apixaban, dabigatran, rivaroxaban Digoxin Diltiazem Estrogen and progestin hormones General anesthetics, such as halothane, isoflurane, methoxyflurane, propofol Glyburide Hydrochlorothiazide Ipratropium Lithium Medications that relax muscles Metformin NSAIDs, medications for pain and inflammation, such as ibuprofen or naproxen Opioid medications for pain Phenothiazines, such as chlorpromazine, mesoridazine, prochlorperazine, thioridazine Pioglitazone This list may not describe all possible interactions. Give your health care  provider a list of all the medicines, herbs, non-prescription drugs, or dietary supplements you use. Also tell them if you smoke, drink alcohol, or use illegal drugs. Some items may interact with your medicine. What should I watch for while using this medication? Visit your care team for regular checks on your progress. Tell your care team if your symptoms do not start to get better or if they get worse. Do not suddenly stop taking this medication. You may develop a severe reaction. Your care team will tell you how much medication to take. If your care team wants you to stop the medication, the dose may be slowly lowered over time to avoid any side effects. Wear a medical ID bracelet or chain. Carry a card that describes your condition. List the medications and doses you take on the card. This medication may affect your coordination, reaction time, or judgment. Do not drive or operate machinery until you know how this medication affects you. Sit up or stand slowly to reduce the risk of dizzy or fainting spells. Drinking alcohol with this medication can increase the risk of these side effects. This medication may cause serious skin reactions. They can happen weeks to months after starting the medication. Contact your care team right away if you notice fevers or flu-like symptoms with a rash. The rash may be red or purple and then turn into blisters or peeling of the skin. You may also notice a red rash with swelling of the face, lips, or lymph nodes in your neck or under your arms. This medication may cause thoughts of suicide or depression. This includes sudden changes in mood, behaviors, or thoughts. These changes can happen at any time but are more common in the beginning of treatment or after a change in dose. Call your care team right away if you experience these thoughts or worsening depression. This medication may slow your child's growth if it is taken for a long time at high doses. Your child's care  team will monitor your child's growth. Using this medication for a long time may weaken your bones. The risk of bone fractures may be increased. Talk to your care team about your bone health. Discuss this medication with your care team if you may be pregnant. Serious birth defects can occur if you take this medication during pregnancy. There are benefits and risks to taking medications during pregnancy. Your care team can help you find the option that works for you. Contraception is recommended while taking this medication. Estrogen and progestin hormones may not work as well while you are taking this medication. Your care team can help you find the  option that works for you. Talk to your care team before breastfeeding. Changes to your treatment plan may be needed. What side effects may I notice from receiving this medication? Side effects that you should report to your care team as soon as possible: Allergic reactions--skin rash, itching, hives, swelling of the face, lips, tongue, or throat High acid level--trouble breathing, unusual weakness or fatigue, confusion, headache, fast or irregular heartbeat, nausea, vomiting High ammonia level--unusual weakness or fatigue, confusion, loss of appetite, nausea, vomiting, seizures Fever that does not go away, decrease in sweat Kidney stones--blood in the urine, pain or trouble passing urine, pain in the lower back or sides Redness, blistering, peeling or loosening of the skin, including inside the mouth Sudden eye pain or change in vision such as blurry vision, seeing halos around lights, vision loss Thoughts of suicide or self-harm, worsening mood, feelings of depression Side effects that usually do not require medical attention (report to your care team if they continue or are bothersome): Burning or tingling sensation in hands or feet Difficulty with paying attention, memory, or speech Dizziness Drowsiness Fatigue Loss of appetite with weight  loss Slow or sluggish movements of the body This list may not describe all possible side effects. Call your doctor for medical advice about side effects. You may report side effects to FDA at 1-800-FDA-1088. Where should I keep my medication? Keep out of the reach of children and pets. Store between 15 and 30 degrees C (59 and 86 degrees F). Protect from moisture. Keep the container tightly closed. Get rid of any unused medication after the expiration date. To get rid of medications that are no longer needed or have expired: Take the medication to a medication take-back program. Check with your pharmacy or law enforcement to find a location. If you cannot return the medication, check the label or package insert to see if the medication should be thrown out in the garbage or flushed down the toilet. If you are not sure, ask your care team. If it is safe to put it in the trash, empty the medication out of the container. Mix the medication with cat litter, dirt, coffee grounds, or other unwanted substance. Seal the mixture in a bag or container. Put it in the trash. NOTE: This sheet is a summary. It may not cover all possible information. If you have questions about this medicine, talk to your doctor, pharmacist, or health care provider.  2024 Elsevier/Gold Standard (2021-12-26 00:00:00)
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
## 2023-05-30 ENCOUNTER — Encounter: Payer: Self-pay | Admitting: Neurology

## 2023-06-02 ENCOUNTER — Telehealth: Payer: Self-pay | Admitting: Neurology

## 2023-06-02 NOTE — Telephone Encounter (Signed)
[facility]: [id number] exp. 06/02/23-07/31/23 sent to GI [phone]

## 2023-06-14 ENCOUNTER — Encounter: Payer: Self-pay | Admitting: Neurology

## 2023-06-15 ENCOUNTER — Telehealth: Payer: Self-pay | Admitting: *Deleted

## 2023-06-15 MED ORDER — EMGALITY 120 MG/ML ~~LOC~~ SOAJ: 120.0000 mg | SUBCUTANEOUS | 5 refills | Status: DC

## 2023-06-15 NOTE — Telephone Encounter (Signed)
Pt states Emgality needs a PA per pharmacy.

## 2023-06-17 ENCOUNTER — Telehealth: Payer: Self-pay

## 2023-06-17 ENCOUNTER — Other Ambulatory Visit (HOSPITAL_COMMUNITY): Payer: Self-pay

## 2023-06-17 NOTE — Telephone Encounter (Signed)
Pharmacy Patient Advocate Encounter   Received notification from Physician's Office that prior authorization for Emgality 120MG /ML auto-injectors (migraine) is required/requested.   Insurance verification completed.   The patient is insured through [facility] .   Per test claim: PA required; PA submitted to above mentioned insurance via CoverMyMeds Key/confirmation #/EOC [id number] Status is pending

## 2023-06-17 NOTE — Telephone Encounter (Signed)
PA request has been Submitted. New Encounter created for follow up. For additional info see Pharmacy Prior Auth telephone encounter from 06/17/2023.

## 2023-06-22 ENCOUNTER — Other Ambulatory Visit (HOSPITAL_COMMUNITY): Payer: Self-pay

## 2023-06-22 NOTE — Telephone Encounter (Signed)
Pharmacy Patient Advocate Encounter  Received notification from [facility] that Prior Authorization for Emgality 120MG /ML auto-injectors (migraine) has been APPROVED from 06/17/2023 to 09/15/2023   PA #/Case ID/Reference #: PA Case: [id number]

## 2023-07-21 ENCOUNTER — Ambulatory Visit
Admission: RE | Admit: 2023-07-21 | Discharge: 2023-07-21 | Disposition: A | Discharge status: Home / Self Care | Payer: Medicaid Other | Source: Ambulatory Visit | Attending: Neurology | Admitting: Neurology

## 2023-07-21 DIAGNOSIS — R51 Headache with orthostatic component, not elsewhere classified: Secondary | ICD-10-CM

## 2023-07-21 DIAGNOSIS — H539 Unspecified visual disturbance: Secondary | ICD-10-CM

## 2023-07-21 DIAGNOSIS — R519 Headache, unspecified: Secondary | ICD-10-CM | POA: Diagnosis not present

## 2023-07-21 MED ORDER — GADOPICLENOL 0.5 MMOL/ML IV SOLN
9.5000 mL | Freq: Once | INTRAVENOUS | Status: AC | PRN
  Administered 2023-07-21: 9.5 mL via INTRAVENOUS

## 2023-09-25 ENCOUNTER — Telehealth: Payer: Self-pay

## 2023-09-25 NOTE — Telephone Encounter (Signed)
*  [facility]  Pharmacy Patient Advocate Encounter   Received notification from Fax that prior authorization for Emgality  120MG /ML auto-injectors (migraine)  is required/requested.   Insurance verification completed.   The patient is insured through [facility] .   Per test claim: PA required; However, NEW/RECENT labs/notes are needed to complete & submit PA request. Please see below.   Has the patient demonstrated significant decrease in the number, frequency, and/or intensity of headaches?*

## 2023-09-25 NOTE — Telephone Encounter (Signed)
 Pharmacy Patient Advocate Encounter   Received notification from RX Request Messages that prior authorization for Emgality  120MG /ML auto-injectors (migraine) is required/requested.   Insurance verification completed.   The patient is insured through [facility] .   Per test claim: PA required; PA submitted to above mentioned insurance via CoverMyMeds Key/confirmation #/EOC [employer] Status is pending

## 2023-10-14 ENCOUNTER — Telehealth: Payer: Medicaid Other | Admitting: Neurology

## 2023-11-02 ENCOUNTER — Other Ambulatory Visit (HOSPITAL_COMMUNITY): Payer: Self-pay

## 2023-11-02 NOTE — Telephone Encounter (Signed)
 Pharmacy Patient Advocate Encounter   Received notification from RX Request Messages that prior authorization for Emgality 120MG /ML auto-injectors (migraine) is required/requested.   Insurance verification completed.   The patient is insured through [facility] .   Per test claim: PA required; PA submitted to above mentioned insurance via CoverMyMeds Key/confirmation #/EOC [id number] Status is pending

## 2023-11-02 NOTE — Telephone Encounter (Signed)
 No response from PA-resubmitted

## 2023-11-05 ENCOUNTER — Other Ambulatory Visit (HOSPITAL_COMMUNITY): Payer: Self-pay | Admitting: *Deleted

## 2023-11-05 DIAGNOSIS — R1310 Dysphagia, unspecified: Secondary | ICD-10-CM

## 2023-11-17 ENCOUNTER — Ambulatory Visit (HOSPITAL_COMMUNITY): Admission: RE | Admit: 2023-11-17 | Source: Ambulatory Visit

## 2023-11-17 ENCOUNTER — Encounter (HOSPITAL_COMMUNITY): Payer: Self-pay

## 2023-11-20 ENCOUNTER — Other Ambulatory Visit (HOSPITAL_COMMUNITY): Payer: Self-pay

## 2023-11-20 NOTE — Telephone Encounter (Signed)
 I called [facility] PA team to follow up-they said it appears as though CMM kicked the PA out and it was never sent to them. We did a new PA over the phone-instant approval. Approval dates are 11/20/2023 thru 11/19/2024 Reference # [id number]

## 2023-11-26 ENCOUNTER — Telehealth (HOSPITAL_COMMUNITY): Payer: Self-pay

## 2023-11-26 NOTE — Telephone Encounter (Signed)
 Patient stated she was declining this appt at this time. She advised her thyroid is swollen and that is why she is having swallowing difficulties. She is planning to have it removed and then if the OP MBS is needed, she will ask for another order. AHARRIS

## 2024-01-06 DIAGNOSIS — R079 Chest pain, unspecified: Secondary | ICD-10-CM | POA: Diagnosis not present

## 2024-02-08 NOTE — Progress Notes (Signed)
 Noted

## 2024-02-24 ENCOUNTER — Other Ambulatory Visit: Payer: Self-pay | Admitting: Neurology

## 2024-03-11 NOTE — Progress Notes (Signed)
 Reviewed labs. TSH slightly elevated. Will plan for repeat labs in 2 weeks.  Clinical team - can you call her and ask her to go to an [facility] site in 2 weeks for repeat labs?  I placed orders.  Thanks

## 2024-03-26 NOTE — Progress Notes (Signed)
 Reviewed repeat labs and TSH now normal with normal FT4. No need for levothyroxine.  I note she does not have any follow-up with Endocrinology.   Clinical nursing team -- can you let her know labs look good. Going forward, her PCP or Endocrinologist can follow yearly thyroid labs.  Can you also ask her to contact her Endocrinology office to see if they want to see her back again?  Thanks

## 2024-04-14 ENCOUNTER — Other Ambulatory Visit: Payer: Self-pay | Admitting: Gastroenterology

## 2024-04-14 ENCOUNTER — Other Ambulatory Visit (INDEPENDENT_AMBULATORY_CARE_PROVIDER_SITE_OTHER)

## 2024-04-14 ENCOUNTER — Encounter: Payer: Self-pay | Admitting: Gastroenterology

## 2024-04-14 ENCOUNTER — Ambulatory Visit: Admitting: Gastroenterology

## 2024-04-14 VITALS — BP 128/70 | HR 97 | Ht 65.0 in | Wt 211.0 lb

## 2024-04-14 DIAGNOSIS — R110 Nausea: Secondary | ICD-10-CM | POA: Diagnosis not present

## 2024-04-14 DIAGNOSIS — K5000 Crohn's disease of small intestine without complications: Secondary | ICD-10-CM

## 2024-04-14 DIAGNOSIS — R1032 Left lower quadrant pain: Secondary | ICD-10-CM

## 2024-04-14 DIAGNOSIS — Z800 Family history of malignant neoplasm of digestive organs: Secondary | ICD-10-CM

## 2024-04-14 DIAGNOSIS — R142 Eructation: Secondary | ICD-10-CM

## 2024-04-14 DIAGNOSIS — R12 Heartburn: Secondary | ICD-10-CM

## 2024-04-14 DIAGNOSIS — Z860100 Personal history of colon polyps, unspecified: Secondary | ICD-10-CM

## 2024-04-14 DIAGNOSIS — R932 Abnormal findings on diagnostic imaging of liver and biliary tract: Secondary | ICD-10-CM

## 2024-04-14 DIAGNOSIS — R194 Change in bowel habit: Secondary | ICD-10-CM

## 2024-04-14 LAB — C-REACTIVE PROTEIN: CRP: <1 mg/dL (ref 0.5–20.0)

## 2024-04-14 MED ORDER — OMEPRAZOLE 20 MG PO CPDR: 20.0000 mg | DELAYED_RELEASE_CAPSULE | Freq: Every day | ORAL | 1 refills | Status: AC

## 2024-04-14 MED ORDER — HYOSCYAMINE SULFATE 0.125 MG SL SUBL: 0.1250 mg | SUBLINGUAL_TABLET | Freq: Two times a day (BID) | SUBLINGUAL | 1 refills | Status: DC | PRN

## 2024-04-14 NOTE — Progress Notes (Addendum)
 Chief Complaint:LLQ pain Primary GI Doctor: Dr. Suzann  HPI:  Patient is a  28  year old female patient with past medical history of IBS, depression, GERD, migraines, and MPGN, who was referred to me by Jennifer Lamar BRAVO, NP on 04/11/24 for a evaluation of LLQ pain .    Interval History    Patient presents with several gastrointestinal complaints.  Patient reports she recently was seen in the ED at [facility] on 8/16 for severe left lower quadrant abdominal pain, nausea and vomiting.  Patient is accompanied by her spouse today.  Patient states that when she went to ED they did a CT scan that showed terminal ileitis and told her she Jennifer Rangel have Crohn's disease.  They also mention fatty liver and enlarged uterus.  GI and OB/GYN referral was placed.  Patient states she was placed on prednisone taper.  She was not given any antibiotics per patient as she had recently been treated for suspected URI.  Patient reports she has not noted any improvement in her symptoms.  Patient reports she can date her symptoms back to last September shortly after her fallopian tubes were removed.  She states initially she started with some nausea and upset stomach.  No vomiting.  And then it proceeded to cause severe abdominal pain and altered bowel habits over the course of the last several months.  Patient states her appetite has fluctuated.  Patient will have a lot of bloating and left lower quadrant pain.  Patient has tried over-the-counter Pepcid  and over-the-counter Gas-X for symptoms with no improvement.  Patient endorses intermittent pyrosis. No dysphagia.  She also notes she has had some mouth lesions and recently had food allergy testing done that showed she was allergic to wheat and yeast.  She reports certain foods seem to make her symptoms worse like fatty greasy foods such as hamburgers.  No blood in stool.  No family hx of IBD or colon CA.  Former smoker, stopped 6+ years ago.   No alcohol use.  No NSAID use.    Patients last colonoscopy/ EGD was back in 2017 in [place], [place] for c/o abdominal pain. She reports she was diagnosed with IBS and had colon polyps that were benign. Recall 5  years?  02/01/24 Patient recently had right hemithyroidectomy for thyroid nodule.  Husband works on Therap[employer], sports [employer].  Surgical History She has a past surgical history that includes Adenoidectomy; Tonsillectomy; Renal biopsy (2006); Colonoscopy w/ biopsies (2017); ENDOSCOPY (10/2015); Pelvic laparoscopy (11/27/2015); and Salpingectomy (Bilateral, 05/13/2023).right hemithyroidectomy for thyroid nodule.  Patient's family history includes: sister with liver, maternal grandmother with breast CA, father with colonic polyps   Wt Readings from Last 3 Encounters:  04/14/24 211 lb (95.7 kg)  05/27/23 209 lb 6.4 oz (95 kg)  08/12/17 168 lb (76.2 kg)    Past Medical History:  Diagnosis Date   Colon polyps    Depression    Environmental allergies    GERD (gastroesophageal reflux disease)    Glomerulonephritis, chronic membranoproliferative    Headache(784.0)    IBS (irritable bowel syndrome)    Migraines    MPGN (membranoproliferative glomerulonephritides)    Pseudocholinesterase deficiency    SVT (supraventricular tachycardia) (HCC)     Past Surgical History:  Procedure Laterality Date   ADENOIDECTOMY     COLONOSCOPY     ENDOMETRIAL ABLATION  2016   ESOPHAGOGASTRODUODENOSCOPY ENDOSCOPY     RENAL BIOPSY     TONSILLECTOMY     TUBAL LIGATION  tubes reoved    Current Outpatient Medications  Medication Sig Dispense Refill   Cholecalciferol 1.25 MG (50000 UT) capsule 1 capsule.     FARXIGA 10 MG TABS tablet Take 10 mg by mouth daily.     hydrOXYzine (ATARAX) 25 MG tablet Take 25 mg by mouth every 8 (eight) hours as needed.     hyoscyamine  (LEVSIN  SL) 0.125 MG SL tablet Place 1 tablet (0.125 mg total) under the tongue every 12 (twelve) hours as needed for cramping (nausea, diarrhea). 50 tablet 1   losartan  (COZAAR) 25 MG tablet Take 50 mg by mouth daily.     omeprazole  (PRILOSEC) 20 MG capsule Take 1 capsule (20 mg total) by mouth daily. 30 capsule 1   PARoxetine (PAXIL) 20 MG tablet Take 20 mg by mouth daily.     valACYclovir (VALTREX) 500 MG tablet Take 500 mg by mouth 2 (two) times daily.     metoprolol tartrate (LOPRESSOR) 25 MG tablet Take 12.5 mg by mouth 2 (two) times daily. (Patient not taking: Reported on 04/14/2024)     ondansetron  (ZOFRAN  ODT) 4 MG disintegrating tablet Take 1 tablet (4 mg total) by mouth every 8 (eight) hours as needed for nausea or vomiting. (Patient not taking: Reported on 04/14/2024) 20 tablet 0   topiramate  (TOPAMAX ) 50 MG tablet Start with one tab at bedtime(50mg ) and in 2 weeks increase to 2 tabs at bedtime(100mg ) (Patient not taking: Reported on 04/14/2024) 60 tablet 6   Ubrogepant 50 MG TABS Take 1 tablet by mouth as needed. (Patient not taking: Reported on 04/14/2024)     Current Facility-Administered Medications  Medication Dose Route Frequency Provider Last Rate Last Admin   Galcanezumab -gnlm SOAJ 240 mg  240 mg Subcutaneous Once Ines Paul B, MD        Allergies as of 04/14/2024 - Review Complete 04/14/2024  Allergen Reaction Noted   Bactrim [sulfamethoxazole-trimethoprim]  03/28/2016   Imitrex [sumatriptan] Nausea And Vomiting 01/13/2012   Nsaids  05/26/2023   Ramipril   05/26/2023   Rizatriptan  05/26/2023   Amoxicillin Rash 01/13/2012   Doxycycline Rash 03/16/2013   Maxolon [metoclopramide] Rash 01/13/2012   Penicillins Rash 03/16/2013   Tape Rash 03/20/2013    Family History  Problem Relation Age of Onset   Migraines Mother    Hypertension Mother    Hypertension Father    Alcohol abuse Father    Colon polyps Father    Migraines Sister    Alcohol abuse Sister    Drug abuse Sister    Drug abuse Brother     Review of Systems:    Constitutional: No weight loss, fever, chills, weakness or fatigue HEENT: Eyes: No change in vision                Ears, Nose, Throat:  No change in hearing or congestion Skin: No rash or itching Cardiovascular: No chest pain, chest pressure or palpitations   Respiratory: No SOB or cough Gastrointestinal: See HPI and otherwise negative Genitourinary: No dysuria or change in urinary frequency Neurological: No headache, dizziness or syncope Musculoskeletal: No new muscle or joint pain Hematologic: No bleeding or bruising Psychiatric: No history of depression or anxiety    Physical Exam:  Vital signs: BP 128/70   Pulse 97   Ht 5' 5 (1.651 m)   Wt 211 lb (95.7 kg)   LMP 03/15/2024 (Approximate)   BMI 35.11 kg/m   Constitutional:   Pleasant female appears to be in NAD, Well developed, Well nourished,  alert and cooperative Throat: Oral cavity and pharynx without inflammation, swelling or lesion.  Respiratory: Respirations even and unlabored. Lungs clear to auscultation bilaterally.   No wheezes, crackles, or rhonchi.  Cardiovascular: Normal S1, S2. Regular rate and rhythm. No peripheral edema, cyanosis or pallor.  Gastrointestinal:  Soft, nondistended, generalized abd tenderness. No rebound or guarding. Normal bowel sounds. No appreciable masses or hepatomegaly. Rectal:  Not performed.  Msk:  Symmetrical without gross deformities. Without edema, no deformity or joint abnormality.  Neurologic:  Alert and  oriented x4;  grossly normal neurologically.  Skin:   Dry and intact without significant lesions or rashes.  RELEVANT LABS AND IMAGING: CBC    Latest Ref Rng & Units 08/12/2017   11:44 PM 03/28/2016    8:00 PM 10/02/2015    8:16 PM  CBC  WBC 4.0 - 10.5 K/uL 9.7  7.5  8.7   Hemoglobin 12.0 - 15.0 g/dL 87.3  86.9  87.0   Hematocrit 36.0 - 46.0 % 37.3  39.8  39.5   Platelets 150 - 400 K/uL 260  323  253      CMP     Latest Ref Rng & Units 03/28/2016    8:00 PM 10/02/2015    8:16 PM 05/07/2013    4:04 PM  CMP  Glucose 65 - 99 mg/dL 873  898  85   BUN 6 - 20 mg/dL 11  11  11     Creatinine 0.44 - 1.00 mg/dL 9.49  9.42  9.49   Sodium 135 - 145 mmol/L 138  138  140   Potassium 3.5 - 5.1 mmol/L 3.3  3.8  3.4   Chloride 101 - 111 mmol/L 106  106  103   CO2 22 - 32 mmol/L 26  24  24    Calcium 8.9 - 10.3 mg/dL 9.2  8.8  8.9   Total Protein 6.5 - 8.1 g/dL  5.9    Total Bilirubin 0.3 - 1.2 mg/dL  0.2    Alkaline Phos 38 - 126 U/L  44    AST 15 - 41 U/L  18    ALT 14 - 54 U/L  15        Assessment: Encounter Diagnoses  Name Primary?   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  Recently seen in ED with CT scan that showed terminal ileitis and suspected liver disease (shown to me on patients phone).  Will obtain records.  Patient denies that she had any viral illness at this time.  She does not take NSAIDs. Will go ahead and order labs to evaluate inflammatory marker and celiac panel. She notes allergy to yeast and wheat. Will also check h pylori diathereix stool test. Recommendations for follow-up MRI liver, family history of liver CA in sister. Will go ahead and proceed with EGD/colonoscopy to fully evaluate symptoms and CT findings. Pt also with hx of colonic polyps, per patient benign, do not have access to records.   Plan: -request CT scan,l[facility] from [facility] omeprazole  20mg  po daily if ok with nephrologist -  GERD diet, no late meals -No NSAIDs -Can use ondansetron  prn  - Send hyoscyamine  prn abd pain , short script - Check CRP, TTG IgA, IgA -order h pylori diathereix  - Order MRI liver to evaluate per recommendations -Schedule EGD in [facility] with Dr. Larry risks and benefits of EGD with  possible biopsies and esophageal dilation were discussed with the patient who agrees to proceed. -Schedule in [facility] with Dr.McGreal  for a colonoscopy. The risks and benefits of colonoscopy with possible polypectomy / biopsies were discussed and the patient agrees to proceed.   Thank you for the courtesy of this consult. Please call me with any questions or concerns.   Kaleiyah Polsky, FNP-C [facility] 04/14/2024, 11:13 AM  Cc: Jennifer Lamar BRAVO, NP  I have reviewed the clinic note as outlined by Jennifer Beal, NP and agree with the assessment, plan and medical decision making.  Jennifer Rangel presents for evaluation of left lower quadrant abdominal pain, nausea, altered bowel habits.  CTAP reported to show terminal ileitis and fatty liver.  Agree with laboratory evaluation, MRI and EGD/colonoscopy for further investigation of symptoms.  [name], MD

## 2024-04-14 NOTE — Patient Instructions (Addendum)
 Gas, bloat, altered bowel  habits Recommend low fodmap diet  Hyoscyamine  prn abdominal pain     Heartburn Recommend GERD diet Omeprazole  20mg  po daily  Please make sure ok with your kidney doctor before taking No NSAIDs  Nausea Ondansetron  prn  We have sent the following medications to your pharmacy for you to pick up at your convenience: SUPREP   Your provider has requested that you go to the basement level for lab work before leaving today. Press B on the elevator. The lab is located at the first door on the left as you exit the elevator.  You have been scheduled for an MRI at [facility] on 04/22/24. Your appointment time is 9:00am. Please arrive to admitting (at main entrance of the hospital) 30 minutes prior to your appointment time for registration purposes. Please make certain not to have anything to eat or drink after midnight prior to your test. In addition, if you have any metal in your body, have a pacemaker or defibrillator, please be sure to let your ordering physician know. This test typically takes 45 minutes to 1 hour to complete. Should you need to reschedule, please call [phone] to do so.  Your provider has ordered Diatherix stool testing for you. You have received a kit from our office today containing all necessary supplies to complete this test. Please carefully read the stool collection instructions provided in the kit before opening the accompanying materials. In addition, be sure there is a label providing your full name and date of birth on the puritan opti-swab tube that is supplied in the kit (if you do not see a label with this information on your test tube, please make us  aware before test collection!). After completing the test, you should secure the purtian tube into the specimen biohazard bag. The [facility] E-Req sheet (including date and time of specimen collection) should be placed into the outside pocket of the specimen  biohazard bag and returned to the [facility] (basement floor of [facility]) within 3 days of collection. Please make sure to give the specimen to a staff member at the lab. DO NOT leave the specimen on the counter.   If the specimen date and time (can be found in the upper right boxed portion of the sheet) are not filled out on the E-Req sheet, the test will NOT be performed.   You have been scheduled for an endoscopy and colonoscopy. Please follow the written instructions given to you at your visit today.  If you use inhalers (even only as needed), please bring them with you on the day of your procedure.  DO NOT TAKE 7 DAYS PRIOR TO TEST- Trulicity (dulaglutide) Ozempic, Wegovy (semaglutide) Mounjaro (tirzepatide) Bydureon Bcise (exanatide extended release)  DO NOT TAKE 1 DAY PRIOR TO YOUR TEST Rybelsus (semaglutide) Adlyxin (lixisenatide) Victoza (liraglutide) Byetta (exanatide) ___________________________________________________________________________  Due to recent changes in healthcare laws, you may see the results of your imaging and laboratory studies on MyChart before your provider has had a chance to review them.  We understand that in some cases there may be results that are confusing or concerning to you. Not all laboratory results come back in the same time frame and the provider may be waiting for multiple results in order to interpret others.  Please give us  48 hours in order for your provider to thoroughly review all the results before contacting the office for clarification of your results.   _______________________________________________________  If your  blood pressure at your visit was 140/90 or greater, please contact your primary care physician to follow up on this.  _______________________________________________________  If you are age 90 or older, your body mass index should be between 23-30. Your Body mass index is 35.11 kg/m. If this is  out of the aforementioned range listed, please consider follow up with your Primary Care Provider.  If you are age 32 or younger, your body mass index should be between 19-25. Your Body mass index is 35.11 kg/m. If this is out of the aformentioned range listed, please consider follow up with your Primary Care Provider.   ________________________________________________________  The [facility] providers would like to encourage you to use MYCHART to communicate with providers for non-urgent requests or questions.  Due to long hold times on the telephone, sending your provider a message by [facility] may be a faster and more efficient way to get a response.  Please allow 48 business hours for a response.  Please remember that this is for non-urgent requests.  _______________________________________________________  [facility] is using a team-based approach to care.  Your team is made up of your doctor and two to three APPS. Our APPS (Nurse Practitioners and Physician Assistants) work with your physician to ensure care continuity for you. They are fully qualified to address your health concerns and develop a treatment plan. They communicate directly with your gastroenterologist to care for you. Seeing the Advanced Practice Practitioners on your physician's team can help you by facilitating care more promptly, often allowing for earlier appointments, access to diagnostic testing, procedures, and other specialty referrals.   Thank you for trusting me with your gastrointestinal care. Deanna May, FNP-C

## 2024-04-15 LAB — TISSUE TRANSGLUTAMINASE ABS,IGG,IGA
(tTG) Ab, IgA: <1 U/mL
(tTG) Ab, IgG: <1 U/mL

## 2024-04-15 LAB — IGA: Immunoglobulin A: 137 mg/dL (ref 47–310)

## 2024-04-19 ENCOUNTER — Ambulatory Visit: Payer: Self-pay | Admitting: Gastroenterology

## 2024-04-22 ENCOUNTER — Ambulatory Visit (HOSPITAL_COMMUNITY)
Admission: RE | Admit: 2024-04-22 | Discharge: 2024-04-22 | Disposition: A | Discharge status: Home / Self Care | Source: Ambulatory Visit | Attending: Gastroenterology | Admitting: Gastroenterology

## 2024-04-22 DIAGNOSIS — R142 Eructation: Secondary | ICD-10-CM | POA: Insufficient documentation

## 2024-04-22 DIAGNOSIS — R1032 Left lower quadrant pain: Secondary | ICD-10-CM | POA: Diagnosis present

## 2024-04-22 DIAGNOSIS — R12 Heartburn: Secondary | ICD-10-CM | POA: Diagnosis present

## 2024-04-22 DIAGNOSIS — R110 Nausea: Secondary | ICD-10-CM | POA: Insufficient documentation

## 2024-04-22 MED ORDER — GADOBUTROL 1 MMOL/ML IV SOLN
9.0000 mL | Freq: Once | INTRAVENOUS | Status: AC | PRN
Start: 2024-04-22 — End: 2024-04-22
  Administered 2024-04-22: 9 mL via INTRAVENOUS

## 2024-05-17 ENCOUNTER — Encounter: Payer: Self-pay | Admitting: Gastroenterology

## 2024-05-24 ENCOUNTER — Telehealth: Payer: Self-pay | Admitting: Gastroenterology

## 2024-05-24 NOTE — Telephone Encounter (Signed)
 Received CT scan from [facility] Date of service 04/03/2024 Impression Terminal ileitis consider inflammatory bowel disease such as Crohn's. Ill-defined hypo at in 18 areas of the liver indeterminate but probably regions of focal fatty infiltration.  Suggest dedicated liver MRI to confirm. Mildly enlarged lobulated myomatous uterus

## 2024-06-03 ENCOUNTER — Encounter: Admitting: Pediatrics

## 2024-06-06 ENCOUNTER — Telehealth: Payer: Self-pay | Admitting: Gastroenterology

## 2024-06-06 ENCOUNTER — Telehealth: Payer: Self-pay

## 2024-06-06 ENCOUNTER — Other Ambulatory Visit: Payer: Self-pay

## 2024-06-06 MED ORDER — NA SULFATE-K SULFATE-MG SULF 17.5-3.13-1.6 GM/177ML PO SOLN: 1.0000 | Freq: Once | ORAL | 0 refills | Status: AC

## 2024-06-06 NOTE — Telephone Encounter (Signed)
 Inbound call from patient stating that her procedure is schedule for October  the 23 rd. But the patient has not received her prep medication. Patient would like her prep to go to [facility] pharmacy is [place] on [address]. Please advise.

## 2024-06-06 NOTE — Telephone Encounter (Signed)
 See telephone encounter.

## 2024-06-08 NOTE — Progress Notes (Unsigned)
 [facility] History and Physical   Primary Care Physician:  Zachary Lamar BRAVO, NP   Reason for Procedure:  Abdominal pain, nausea, vomiting, pyrosis, change in bowel habits, bloating, CT scan with terminal ileitis, history of colon polyps  Plan:    Colonoscopy     HPI: Jennifer Rangel is a 28 y.o. female undergoing colonoscopy for investigation of left lower quadrant abdominal pain, nausea, vomiting, pyrosis, bloating.  Patient reports a prior history of IBS.  Colonoscopy in 2017 reported to show polyps but otherwise normal.  Due to worsening symptoms patient was seen in the [facility] emergency department over the summer and reports that his CT scan showed terminal ileitis.  She was told she might have Crohn's disease.  Reportedly given a prednisone taper but no antibiotics.  Denied improvement of her symptoms at the time she was seen in clinic.  Has been using Gas-X and Pepcid .  Additional laboratory studies ruled out H. Pylori, celiac disease.  CRP normal.   Past Medical History:  Diagnosis Date   Colon polyps    Depression    Environmental allergies    GERD (gastroesophageal reflux disease)    Glomerulonephritis, chronic membranoproliferative    Headache(784.0)    IBS (irritable bowel syndrome)    Migraines    MPGN (membranoproliferative glomerulonephritides)    Pseudocholinesterase deficiency    SVT (supraventricular tachycardia)     Past Surgical History:  Procedure Laterality Date   ADENOIDECTOMY     COLONOSCOPY     ENDOMETRIAL ABLATION  2016   ESOPHAGOGASTRODUODENOSCOPY ENDOSCOPY     RENAL BIOPSY     TONSILLECTOMY     TUBAL LIGATION     tubes reoved    Prior to Admission medications   Medication Sig Start Date End Date Taking? Authorizing Provider  Cholecalciferol 1.25 MG (50000 UT) capsule 1 capsule. 09/30/23   [provider]  FARXIGA 10 MG TABS tablet Take 10 mg by mouth daily. 10/16/23   [provider]  hydrOXYzine (ATARAX) 25 MG  tablet Take 25 mg by mouth every 8 (eight) hours as needed. 05/16/16   [provider]  hyoscyamine  (ANASPAZ ) 0.125 MG TBDP disintergrating tablet DISSOLVE 1 TABLET UNDER THE TONGUE EVERY 12 HOURS AS NEEDED FOR CRAMPING (NAUSEA/DIARRHEA) 04/14/24   May, Deanna J, NP  losartan (COZAAR) 25 MG tablet Take 50 mg by mouth daily.    [provider]  metoprolol tartrate (LOPRESSOR) 25 MG tablet Take 12.5 mg by mouth 2 (two) times daily. Patient not taking: Reported on 04/14/2024 01/20/22   [provider]  omeprazole  (PRILOSEC) 20 MG capsule Take 1 capsule (20 mg total) by mouth daily. 04/14/24   May, Deanna J, NP  ondansetron  (ZOFRAN  ODT) 4 MG disintegrating tablet Take 1 tablet (4 mg total) by mouth every 8 (eight) hours as needed for nausea or vomiting. Patient not taking: Reported on 04/14/2024 10/03/15   Ward, Josette SAILOR, DO  PARoxetine (PAXIL) 20 MG tablet Take 20 mg by mouth daily. 05/06/23   [provider]  topiramate  (TOPAMAX ) 50 MG tablet Start with one tab at bedtime(50mg ) and in 2 weeks increase to 2 tabs at bedtime(100mg ) Patient not taking: Reported on 04/14/2024 05/27/23   Ines Onetha NOVAK, MD  Ubrogepant 50 MG TABS Take 1 tablet by mouth as needed. Patient not taking: Reported on 04/14/2024 04/15/23   [provider]  valACYclovir (VALTREX) 500 MG tablet Take 500 mg by mouth 2 (two) times daily.    [provider]  Current Outpatient Medications  Medication Sig Dispense Refill   Cholecalciferol 1.25 MG (50000 UT) capsule 1 capsule.     FARXIGA 10 MG TABS tablet Take 10 mg by mouth daily.     hydrOXYzine (ATARAX) 25 MG tablet Take 25 mg by mouth every 8 (eight) hours as needed.     hyoscyamine  (ANASPAZ ) 0.125 MG TBDP disintergrating tablet DISSOLVE 1 TABLET UNDER THE TONGUE EVERY 12 HOURS AS NEEDED FOR CRAMPING (NAUSEA/DIARRHEA) 50 tablet 1   losartan (COZAAR) 25 MG tablet Take 50 mg by mouth daily.     metoprolol tartrate (LOPRESSOR) 25 MG  tablet Take 12.5 mg by mouth 2 (two) times daily. (Patient not taking: Reported on 04/14/2024)     omeprazole  (PRILOSEC) 20 MG capsule Take 1 capsule (20 mg total) by mouth daily. 30 capsule 1   ondansetron  (ZOFRAN  ODT) 4 MG disintegrating tablet Take 1 tablet (4 mg total) by mouth every 8 (eight) hours as needed for nausea or vomiting. (Patient not taking: Reported on 04/14/2024) 20 tablet 0   PARoxetine (PAXIL) 20 MG tablet Take 20 mg by mouth daily.     topiramate  (TOPAMAX ) 50 MG tablet Start with one tab at bedtime(50mg ) and in 2 weeks increase to 2 tabs at bedtime(100mg ) (Patient not taking: Reported on 04/14/2024) 60 tablet 6   Ubrogepant 50 MG TABS Take 1 tablet by mouth as needed. (Patient not taking: Reported on 04/14/2024)     valACYclovir (VALTREX) 500 MG tablet Take 500 mg by mouth 2 (two) times daily.     Current Facility-Administered Medications  Medication Dose Route Frequency Provider Last Rate Last Admin   Galcanezumab -gnlm SOAJ 240 mg  240 mg Subcutaneous Once Ines Paul B, MD        Allergies as of 06/09/2024 - Review Complete 04/14/2024  Allergen Reaction Noted   Bactrim [sulfamethoxazole-trimethoprim]  03/28/2016   Imitrex [sumatriptan] Nausea And Vomiting 01/13/2012   Nsaids  05/26/2023   Ramipril   05/26/2023   Rizatriptan  05/26/2023   Amoxicillin Rash 01/13/2012   Doxycycline Rash 03/16/2013   Maxolon [metoclopramide] Rash 01/13/2012   Penicillins Rash 03/16/2013   Tape Rash 03/20/2013    Family History  Problem Relation Age of Onset   Migraines Mother    Hypertension Mother    Hypertension Father    Alcohol abuse Father    Colon polyps Father    Migraines Sister    Alcohol abuse Sister    Drug abuse Sister    Drug abuse Brother     Social History   Socioeconomic History   Marital status: Single    Spouse name: Not on file   Number of children: Not on file   Years of education: Not on file   Highest education level: Not on file  Occupational  History   Not on file  Tobacco Use   Smoking status: Never   Smokeless tobacco: Never  Vaping Use   Vaping status: Former  Substance and Sexual Activity   Alcohol use: No   Drug use: No   Sexual activity: Yes    Birth control/protection: None  Other Topics Concern   Not on file  Social History Narrative   Caffiene, occasional   Working Arts development officer, 5  children   Live with Darina    Social Drivers of Health   Financial Resource Strain: Not on file  Food Insecurity: Low Risk  (03/25/2024)   Received from [facility]   Hunger Vital Sign    Within the past 12  months, you worried that your food would run out before you got money to buy more: Never true    Within the past 12 months, the food you bought just didn't last and you didn't have money to get more. : Never true  Transportation Needs: No Transportation Needs (03/25/2024)   Received from [facility]    In the past 12 months, has lack of reliable transportation kept you from medical appointments, meetings, work or from getting things needed for daily living? : No  Physical Activity: Not on file  Stress: Not on file  Social Connections: Not on file  Intimate Partner Violence: Not At Risk (03/06/2020)   Received from [facility] visits prior to 10/18/2022.   Humiliation, Afraid, Rape, and Kick questionnaire    Within the last year, have you been afraid of your partner or ex-partner?: No    Within the last year, have you been humiliated or emotionally abused in other ways by your partner or ex-partner?: No    Within the last year, have you been kicked, hit, slapped, or otherwise physically hurt by your partner or ex-partner?: No    Within the last year, have you been raped or forced to have any kind of sexual activity by your partner or ex-partner?: No    Review of Systems:  All other review of systems negative except as mentioned in the HPI.  Physical Exam: Vital signs There were no  vitals taken for this visit.  General:   Alert,  Well-developed, well-nourished, pleasant and cooperative in NAD Airway:  Mallampati  Lungs:  Clear throughout to auscultation.   Heart:  Regular rate and rhythm; no murmurs, clicks, rubs,  or gallops. Abdomen:  Soft, nontender and nondistended. Normal bowel sounds.   Neuro/Psych:  Normal mood and affect. A and O x 3  [name], MD [facility]

## 2024-06-09 ENCOUNTER — Encounter: Payer: Self-pay | Admitting: Pediatrics

## 2024-06-09 ENCOUNTER — Ambulatory Visit: Admitting: Pediatrics

## 2024-06-09 VITALS — BP 103/64 | HR 80 | Resp 13

## 2024-06-09 DIAGNOSIS — K269 Duodenal ulcer, unspecified as acute or chronic, without hemorrhage or perforation: Secondary | ICD-10-CM

## 2024-06-09 DIAGNOSIS — Z860100 Personal history of colon polyps, unspecified: Secondary | ICD-10-CM

## 2024-06-09 DIAGNOSIS — K529 Noninfective gastroenteritis and colitis, unspecified: Secondary | ICD-10-CM

## 2024-06-09 DIAGNOSIS — R12 Heartburn: Secondary | ICD-10-CM

## 2024-06-09 DIAGNOSIS — K649 Unspecified hemorrhoids: Secondary | ICD-10-CM

## 2024-06-09 DIAGNOSIS — K449 Diaphragmatic hernia without obstruction or gangrene: Secondary | ICD-10-CM

## 2024-06-09 DIAGNOSIS — K2950 Unspecified chronic gastritis without bleeding: Secondary | ICD-10-CM

## 2024-06-09 DIAGNOSIS — R112 Nausea with vomiting, unspecified: Secondary | ICD-10-CM

## 2024-06-09 DIAGNOSIS — K633 Ulcer of intestine: Secondary | ICD-10-CM | POA: Diagnosis not present

## 2024-06-09 DIAGNOSIS — R1032 Left lower quadrant pain: Secondary | ICD-10-CM

## 2024-06-09 DIAGNOSIS — R935 Abnormal findings on diagnostic imaging of other abdominal regions, including retroperitoneum: Secondary | ICD-10-CM

## 2024-06-09 DIAGNOSIS — R140 Abdominal distension (gaseous): Secondary | ICD-10-CM

## 2024-06-09 DIAGNOSIS — R194 Change in bowel habit: Secondary | ICD-10-CM

## 2024-06-09 DIAGNOSIS — R109 Unspecified abdominal pain: Secondary | ICD-10-CM

## 2024-06-09 MED ORDER — SODIUM CHLORIDE 0.9 % IV SOLN: 500.0000 mL | INTRAVENOUS | Status: DC

## 2024-06-09 NOTE — Op Note (Signed)
 [facility] Patient Name: Jennifer Rangel Procedure Date: 06/09/2024 10:30 AM MRN: [id number] Endoscopist: [name] , MD, [id number] Age: 28 Referring MD:  Date of Birth: 1996/07/20 Gender: Female Account #: [id number] Procedure:                Colonoscopy Indications:              Last colonoscopy: 2017, Abdominal pain in the left                            lower quadrant, Follow-up for history of colon                            polyps of uncertain behavior, Abnormal CT of the GI                            tract, Change in bowel habits Medicines:                Monitored Anesthesia Care Procedure:                Pre-Anesthesia Assessment:                           - Prior to the procedure, a History and Physical                            was performed, and patient medications and                            allergies were reviewed. The patient's tolerance of                            previous anesthesia was also reviewed. The risks                            and benefits of the procedure and the sedation                            options and risks were discussed with the patient.                            All questions were answered, and informed consent                            was obtained. Prior Anticoagulants: The patient has                            taken no anticoagulant or antiplatelet agents. ASA                            Grade Assessment: II - A patient with mild systemic                            disease. After reviewing the risks and benefits,  the patient was deemed in satisfactory condition to                            undergo the procedure.                           After obtaining informed consent, the colonoscope                            was passed under direct vision. Throughout the                            procedure, the patient's blood pressure, pulse, and                            oxygen saturations were  monitored continuously. The                            Olympus Scope SN [id number] was introduced through the                            anus and advanced to the terminal ileum. The                            colonoscopy was performed without difficulty. The                            patient tolerated the procedure well. The quality                            of the bowel preparation was good. The terminal                            ileum, ileocecal valve, appendiceal orifice, and                            rectum were photographed. Scope In: 10:45:24 AM Scope Out: 11:00:56 AM Scope Withdrawal Time: 0 hours 13 minutes 44 seconds  Total Procedure Duration: 0 hours 15 minutes 32 seconds  Findings:                 Hemorrhoids were found on perianal exam.                           The digital rectal exam was normal. Pertinent                            negatives include normal sphincter tone and no                            palpable rectal lesions.                           Normal mucosa was found in the recto-sigmoid colon,  in the descending colon, in the transverse colon                            and in the distal ascending colon. Biopsies were                            taken with a cold forceps for histology.                           Scattered mild inflammation characterized by                            erosions and erythema was found in the proximal                            ascending colon and in the cecum. Biopsies were                            taken with a cold forceps for histology.                           Localized moderate inflammation characterized by                            congestion (edema), erosions and erythema was found                            at the ileocecal valve. Biopsies were taken with a                            cold forceps for histology.                           Segmental moderate inflammation characterized by                             congestion (edema), erosions and erythema was found                            in the terminal ileum extending from the os of the                            ICV to 5 cm proximal to the os of the ICV. The TI                            mucosa proximal to the inflamed segment appeared                            normal. Biopsies were taken with a cold forceps for                            histology.  Hemorrhoids were found during retroflexion. Complications:            No immediate complications. Estimated blood loss:                            Minimal. Estimated Blood Loss:     Estimated blood loss was minimal. Impression:               - Hemorrhoids found on perianal exam.                           - Normal mucosa in the recto-sigmoid colon, in the                            descending colon, in the transverse colon and in                            the distal ascending colon. Biopsied.                           - Scattered mild inflammation was found in the                            proximal ascending colon and in the cecum. Biopsied.                           - Localized moderate inflammation was found at the                            ileocecal valve. Biopsied.                           - 5 cm of moderate inflammation was found in the                            ileum secondary to ileitis. Biopsied. TI proximal                            to the inflamed segment appeared normal.                           - External and internal hemorrhoids. Recommendation:           - Discharge patient to home (ambulatory).                           - Await pathology results. If biopsy results                            support diagnosis of Crohn's diease start course of                            budesonide and discuss with patient long term                            Crohn's  disease maintenance therapy.                           - Return to GI clinic in 4-6 weeks  with Dr. Suzann                            or APP Oscar Coombs or [facility] May).                           - The findings and recommendations were discussed                            with the patient's family.                           - Patient has a contact number available for                            emergencies. The signs and symptoms of potential                            delayed complications were discussed with the                            patient. Return to normal activities tomorrow.                            Written discharge instructions were provided to the                            patient. Inocente Suzann, MD 06/09/2024 11:15:58 AM This report has been signed electronically.

## 2024-06-09 NOTE — Op Note (Signed)
 [facility] Patient Name: Jennifer Rangel Procedure Date: 06/09/2024 10:31 AM MRN: [id number] Endoscopist: [name] , MD, [id number] Age: 28 Referring MD:  Date of Birth: 1995/11/04 Gender: Female Account #: [id number] Procedure:                Upper GI endoscopy Indications:              Abdominal pain, Heartburn, Abdominal bloating,                            Nausea with vomiting Medicines:                Monitored Anesthesia Care Procedure:                Pre-Anesthesia Assessment:                           - Prior to the procedure, a History and Physical                            was performed, and patient medications and                            allergies were reviewed. The patient's tolerance of                            previous anesthesia was also reviewed. The risks                            and benefits of the procedure and the sedation                            options and risks were discussed with the patient.                            All questions were answered, and informed consent                            was obtained. Prior Anticoagulants: The patient has                            taken no anticoagulant or antiplatelet agents. ASA                            Grade Assessment: II - A patient with mild systemic                            disease. After reviewing the risks and benefits,                            the patient was deemed in satisfactory condition to                            undergo the procedure.  After obtaining informed consent, the endoscope was                            passed under direct vision. Throughout the                            procedure, the patient's blood pressure, pulse, and                            oxygen saturations were monitored continuously. The                            GIF [id number] #[id number] was introduced through the                            mouth, and advanced to the second  part of duodenum.                            The upper GI endoscopy was accomplished without                            difficulty. The patient tolerated the procedure                            well. Scope In: Scope Out: Findings:                 The examined esophagus was normal.                           The gastric body, gastric antrum, cardia (on                            retroflexion) and gastric fundus (on retroflexion)                            were normal. Biopsies were taken with a cold                            forceps for Helicobacter pylori testing.                           Localized mild inflammation characterized by                            erosions and erythema was found in the prepyloric                            region of the stomach. Biopsies were taken with a                            cold forceps for Helicobacter pylori testing.                           A small hiatal hernia was present.  A single localized erosion without bleeding was                            found in the duodenal bulb.                           The second portion of the duodenum was normal.                            Biopsies for histology were taken with a cold                            forceps for evaluation of celiac disease. Complications:            No immediate complications. Estimated blood loss:                            Minimal. Estimated Blood Loss:     Estimated blood loss was minimal. Impression:               - Normal esophagus.                           - Normal gastric body, antrum, cardia and gastric                            fundus. Biopsied.                           - Gastritis. Biopsied.                           - Small hiatal hernia.                           - Duodenal erosion without bleeding.                           - Normal second portion of the duodenum. Biopsied. Recommendation:           - Await pathology results.                            - Perform a colonoscopy today.                           - The findings and recommendations were discussed                            with the patient's family. [name], MD 06/09/2024 11:04:11 AM This report has been signed electronically.

## 2024-06-09 NOTE — Patient Instructions (Addendum)
 Continue present medications. Await pathology results.   Return to GI clinic in 4-6 weeks for follow-up.  YOU HAD AN ENDOSCOPIC PROCEDURE TODAY AT THE [facility]:   Refer to the procedure report that was given to you for any specific questions about what was found during the examination.  If the procedure report does not answer your questions, please call your gastroenterologist to clarify.  If you requested that your care partner not be given the details of your procedure findings, then the procedure report has been included in a sealed envelope for you to review at your convenience later.  YOU SHOULD EXPECT: Some feelings of bloating in the abdomen. Passage of more gas than usual.  Walking can help get rid of the air that was put into your GI tract during the procedure and reduce the bloating. If you had a lower endoscopy (such as a colonoscopy or flexible sigmoidoscopy) you may notice spotting of blood in your stool or on the toilet paper. If you underwent a bowel prep for your procedure, you may not have a normal bowel movement for a few days.  Please Note:  You might notice some irritation and congestion in your nose or some drainage.  This is from the oxygen used during your procedure.  There is no need for concern and it should clear up in a day or so.  SYMPTOMS TO REPORT IMMEDIATELY:  Following lower endoscopy (colonoscopy or flexible sigmoidoscopy):  Excessive amounts of blood in the stool  Significant tenderness or worsening of abdominal pains  Swelling of the abdomen that is new, acute  Fever of 100F or higher  Following upper endoscopy (EGD)  Vomiting of blood or coffee ground material  New chest pain or pain under the shoulder blades  Painful or persistently difficult swallowing  New shortness of breath  Fever of 100F or higher  Black, tarry-looking stools  For urgent or emergent issues, a gastroenterologist can be reached at any hour by calling [phone]  [phone]. Do not use MyChart messaging for urgent concerns.    DIET:  We do recommend a small meal at first, but then you may proceed to your regular diet.  Drink plenty of fluids but you should avoid alcoholic beverages for 24 hours.  ACTIVITY:  You should plan to take it easy for the rest of today and you should NOT DRIVE or use heavy machinery until tomorrow (because of the sedation medicines used during the test).    FOLLOW UP: Our staff will call the number listed on your records the next business day following your procedure.  We will call around 7:15- 8:00 am to check on you and address any questions or concerns that you may have regarding the information given to you following your procedure. If we do not reach you, we will leave a message.     If any biopsies were taken you will be contacted by phone or by letter within the next 1-3 weeks.  Please call us  at [phone] if you have not heard about the biopsies in 3 weeks.    SIGNATURES/CONFIDENTIALITY: You and/or your care partner have signed paperwork which will be entered into your electronic medical record.  These signatures attest to the fact that that the information above on your After Visit Summary has been reviewed and is understood.  Full responsibility of the confidentiality of this discharge information lies with you and/or your care-partner.

## 2024-06-09 NOTE — Progress Notes (Signed)
 Sedate, gd SR, tolerated procedure well, VSS, report to RN

## 2024-06-09 NOTE — Progress Notes (Signed)
 Called to room to assist during endoscopic procedure.  Patient ID and intended procedure confirmed with present staff. Received instructions for my participation in the procedure from the performing physician.

## 2024-06-10 ENCOUNTER — Telehealth: Payer: Self-pay

## 2024-06-10 NOTE — Telephone Encounter (Signed)
  Follow up Call-     06/09/2024    9:21 AM  Call back number  Post procedure Call Back phone  # [phone]  Permission to leave phone message Yes     Patient questions:  Do you have a fever, pain , or abdominal swelling? No. Pain Score  0 *  Have you tolerated food without any problems? Yes.    Have you been able to return to your normal activities? Yes.    Do you have any questions about your discharge instructions: Diet   No. Medications  No. Follow up visit  No.  Do you have questions or concerns about your Care? No.  Actions: * If pain score is 4 or above: No action needed, pain <4.

## 2024-06-14 ENCOUNTER — Ambulatory Visit: Payer: Self-pay | Admitting: Pediatrics

## 2024-06-14 LAB — SURGICAL PATHOLOGY

## 2024-06-15 MED ORDER — BUDESONIDE 3 MG PO CPEP: ORAL_CAPSULE | ORAL | 0 refills | Status: AC

## 2024-06-15 NOTE — Addendum Note (Signed)
 Addended by: MERCER CRISTINO SAILOR on: 06/15/2024 09:17 AM   Modules accepted: Orders

## 2024-06-15 NOTE — Telephone Encounter (Signed)
 Budesonide taper sent to [facility] in [place].

## 2024-08-01 ENCOUNTER — Ambulatory Visit: Admitting: Physician Assistant

## 2024-08-30 NOTE — Progress Notes (Addendum)
 "    08/31/2024 Jennifer Rangel [id number] 1996/07/27  Referring provider: Zachary Lamar BRAVO, NP Primary GI doctor: Dr. Suzann  ASSESSMENT AND PLAN:  Mild ileal Crohn's disease primarily AB pain which has improved 06/09/2024 colonoscopy Dr. Suzann 06/15/2024 budesonide  9 mg 30 days and 2 capsules 30 days and 1 capsule daily 04/14/2024 CRP negative Does complain of bilateral hand/feet pain, worse in the morning -Will go ahead and get TB screening, hepatitis B surface antigen in case there is a need for escalation of therapy in the future - Would get hepatitis A and B antibodies to evaluate for immunity - Discussed vaccinations but declines - Get CBC c-Met fecal calprotectin sed rate CRP - will refer to rheumatology for joint pain, rule out crohn's arthritis - will do follow up with Dr. Suzann  GERD has improved, still with burping/gas 06/09/2024 EGD Dr. Suzann small HH negative H. pylori gastritis normal esophagus negative celiac Negative celiac, H. Pylori - stop sodas - samples of FDGard - continue PPI - consider SIBO testing or empiric treatment  Fatty liver with hemangioma without LFT elevation    Latest Ref Rng & Units 10/02/2015    8:16 PM 04/26/2013    2:43 PM 03/21/2013    6:30 AM  Hepatic Function  Total Protein 6.5 - 8.1 g/dL 5.9  6.1  5.8   Albumin 3.5 - 5.0 g/dL 3.0  3.2  2.6   AST 15 - 41 U/L 18  17  15    ALT 14 - 54 U/L 15  18  34   Alk Phosphatase 38 - 126 U/L 44  61  88   Total Bilirubin 0.3 - 1.2 mg/dL 0.2  0.4  0.2   Bilirubin, Direct 0.0 - 0.3 mg/dL   <9.8   Will check hepatitis panel -Check for immunity -Continue to monitor CBC/LFTs -Consider elastography  Membranoproliferative glomerulonephritis  Follows with [facility], Dr. Taewoo Rangel  Vitamin D  def 12/18/2023 Vitamin D  13 Will send in 49999 vitamin D  one a week for 8 weeks -Recheck OV  I have reviewed the clinic note as outlined by Jennifer Coombs, PA and agree with the assessment, plan and  medical decision making.  Jennifer Rangel returns to the office for follow-up of moderate nonstricturing, nonpenetrating ileocolonic Crohn's disease diagnosed on colonoscopy 05/2024 after CT scan 03/2024 showed terminal ileitis.  After her colonoscopy she was managed with a budesonide  taper which yielded improvement in symptoms of abdominal pain.  Fecal calprotectin is elevated at 1690 after treatment with budesonide  suggesting ongoing inflammation.  She has concomitant symptoms of arthralgias that may or may not be IBD associated.  She has been referred to rheumatology for further evaluation scheduled 10/2024.    At this juncture, I think she warrants establishing on maintenance therapy for IBD and possible concomitant arthropathy.  Given that she is of childbearing age may also need to consider safety in the setting of pregnancy and lactation depending upon her intentions.  Records suggest she has had bilateral salpingectomy. Medical options could include: Infliximab, adalimumab, Stelara, Skyrizi, Tremfya, Rinvoq.  She would need to understand the implications of long-term infusion versus injection therapy.  She expressed concern regarding self injections -therefore, could consider SQ agents with longer dosing intervals of every 8 weeks such as Stelara, Skyrizi or Tremfya.  Since she has had BTL without intentions of becoming pregnant could consider Jak inhibitor therapy with Rinvoq if approved by insurance as recent FDA changes no longer mandate antecedent anti-TNF.  Could trial sulfasalazine in the  short-term to see if joint pains respond -positive response would suggest inflammatory arthropathy.  Bactrim is listed as an adverse reaction with nausea but no sulfa allergy.  If she is relatively asymptomatic and prefers to wait on decision making until she has been seen by rheumatology we can do so.  Otherwise, can proceed with one of the agents out lined above.  Jennifer Hausen, MD   Patient Care Team: Jennifer Lamar BRAVO, NP as PCP - General  HISTORY OF PRESENT ILLNESS: 29 y.o. female with past medical history of IBS, depression, GERD, migraines, and MPGN presents for evaluation of probable mild ileal Crohn's without complication.  Last seen in the office on 04/14/2024 for abdominal pain with abnormal CT showing terminal ileitis.  Subsequent colonoscopy 06/09/2024 Dr. Hausen showed moderate inflammation ileocecal scattered mild inflammation ascending colon cecum with pathology showing erosions and ulcerations without chronicity potentially NSAIDs versus early mild inflammatory bowel disease.  Patient denies any NSAID use, started on budesonide  taper with Dr. Hausen.  Current History Discussed the use of AI scribe software for clinical note transcription with the patient, who gave verbal consent to proceed.  The patient is a 29 year old woman with Crohn's disease and membranoproliferative glomerulonephritis type I who presents for post-colonoscopy follow-up and management of gastrointestinal and joint symptoms.  Colonoscopy on October 23rd was performed; patient reports being told it showed possible Crohn's disease involving the small bowel and colon. She avoids NSAIDs and prescription anti-inflammatories due to kidney disease. Prolonged prednisone use from age 29 to 30-17 was discontinued due to weight gain and pregnancy. Currently on budesonide ; patient reports doing fine on the medication. Previously experienced mouth ulcers but denies current oral ulcers. Persistent bloating and excessive burping described as feeling gassy 24/7, with bloating localized under the breastbone. Denies significant reflux, nausea, or vomiting; heartburn occurs only with certain foods. Bowel habits are stable. Denies black or bloody stools. Severe abdominal pain around the belly button previously limited ambulation but has subsided. No alcohol, tobacco, or illicit drug use. Drinks regular Sprite daily or less  frequently.  Chronic joint pain and stiffness, especially in hands and ankles, with morning stiffness requiring effort to get out of bed. Frequent leg pain. Prior workup included leg ultrasound and physical therapy without benefit. Diagnosed with tendinitis in the thumb and unspecified hand issues. No prior rheumatology evaluation. Denies current rashes except for one episode during pregnancy attributed to pregnancy.  Membranoproliferative glomerulonephritis type I managed by nephrology; currently on Cozaar and Farxiga. No recent kidney biopsy due to concurrent colonoscopy and thyroid surgery. History of partial thyroidectomy and ongoing fatigue despite normal thyroid levels. Significant vitamin D  deficiency (level 13 in May 2025) with intermittent use of vitamin D , iron, calcium, and magnesium  supplements. Discontinued Tzhncb after developing bruising and worsening gastrointestinal symptoms, which she associates with the medication. Denies hypertension and diabetes. History of iron deficiency requiring infusions during pregnancy and chronic low iron. History of bilateral salpingectomy with no plans for further pregnancies.   Inflammatory Bowel Disease History  Last colonoscopy:  06/09/2024 colonoscopy Dr. Hausen good prep normal mucosa rectosigmoid colon descending colon transverse colon and distal ascending colon Scattered mild inflammation proximal ascending colon and cecum Localized moderate inflammation found in ileocecal valve 5 cm of moderate inflammation found in ileum secondary to ileitis TI proximal to the inflamed segment appeared normal External/internal hemorrhoids PATH: 3. Surgical [P], small bowel, terminal ileum :  -  SMALL INTESTINAL MUCOSA WITH ARCHITECTURAL DISARRAY, MILD ACTIVITY AND FOCAL EROSION. -  NEGATIVE  FOR GRANULOMAS, VIRAL CYTOPATHIC EFFECT AND DYSPLASIA. 4. Surgical [P], colon, cecum : -  1 FRAGMENT OF COLONIC MUCOSA WITH EROSION/ULCERATION AND GRANULATION TISSUE  WITH ADJACENT REACTIVE/REPARATIVE CHANGE.  SEPARATE FRAGMENTS OF COLONIC MUCOSA WITH NO SIGNIFICANT PATHOLOGY. -  NEGATIVE FOR DEFINITIVE EVIDENCE OF CHRONICITY, GRANULOMAS, VIRAL CYTOPATHIC EFFECT AND DYSPLASIA.  5. Surgical [P], colon, descending :  -  COLONIC MUCOSA WITH SCATTERED PROMINENT LYMPHOID AGGREGATE, OTHERWISE NO SIGNIFICANT PATHOLOGY.  6. Surgical [P], colon, transverse :  -  COLONIC MUCOSA WITH NO SIGNIFICANT PATHOLOGY. 7. Surgical [P], colon, descending :  -  COLONIC MUCOSA WITH NO SIGNIFICANT PATHOLOGY.  8. Surgical [P], colon, recto-sigmoid :   -  COLONIC MUCOSA WITH NO SIGNIFICANT PATHOLOGY. NOTE: IT IS NOTED THAT THE PATIENT HAS A CLINICAL HISTORY OF ALTERED BOWEL HABITS WITH LEFT LOWER QUADRANT PAIN AND BLOATING.  THE ABOVE FINDINGS ARE NONSPECIFIC WITH THE 2 MAIN HISTOMORPHOLOGIC DIFFERENTIAL DIAGNOSES GIVEN THE  LOCATION OF TERMINAL ILEUM AND CECAL FINDINGS BEING BETWEEN MEDICATIONS SUCH AS  NSAIDS AND POSSIBLE INFLAMMATORY BOWEL DISEASE, BECAUSE OTHERS. CLINICAL/ENDOSCOPIC CORRELATION RECOMMENDED.  Last endoscopy:  06/09/2024 EGD Dr. Suzann small HH negative H. pylori gastritis normal esophagus negative celiac Last Abd CT/CTE/MRE:  04/03/2024 [facility] terminal ileitis 04/22/2024 MR liver with and without contrast for indeterminate liver lesions, benign hemangioma and focal fat infiltration left hepatic lobe stomach and bowel unremarkable Extraintestinal manifestations: Arthralgias and oral ulcers Surgical history: no surgery  IBD Medication History 04/02/2024 abdominal pain nausea vomiting, CT showed terminal ileitis Subsequent colonoscopy 06/09/2024 Dr. Suzann showed moderate inflammation ileocecal scattered mild inflammation ascending colon cecum with pathology showing erosions and ulcerations without chronicity potentially NSAIDs versus early mild inflammatory bowel disease.   Patient denies any NSAID use started on budesonide  taper with Dr.  Suzann.  Review of Data  The following data was reviewed at the time of this encounter:  IBD Labs   Inflammatory markers    Latest Ref Rng & Units 04/14/2024   10:33  Inflammatory Markers  CRP 0.5 - 20.0 mg/dL <8.9     Prebiologic Labs    Therapeutic Drug Monitoring     Health Maintenance:    - DEXA: if risk factors for osteoporosis - NA -Pap Smear annually if immunosuppressed - UTD -Skin check / derm eval  - Annual depression screening: continue follow up PCP - Vitamin D  screening: has def 12/18/2023, not on treatment  Vaccinations:   There is no immunization history on file for this patient. - Annual Flu Vaccine - declines, states has rash with it - Pneumococcal Vaccine (PCV 20) if receiving immunosuppression: - Date suggest getting prevnar 20      - Zoster vaccine if over age 60  RELEVANT GI HISTORY, LABS, IMAGING: CBC    Component Value Date/Time   WBC 9.7 08/12/2017 2344   RBC 4.35 08/12/2017 2344   HGB 12.6 08/12/2017 2344   HCT 37.3 08/12/2017 2344   PLT 260 08/12/2017 2344   MCV 85.7 08/12/2017 2344   MCH 29.0 08/12/2017 2344   MCHC 33.8 08/12/2017 2344   RDW 12.5 08/12/2017 2344   LYMPHSABS 1.7 05/07/2013 1604   MONOABS 0.8 05/07/2013 1604   EOSABS 0.1 05/07/2013 1604   BASOSABS 0.0 05/07/2013 1604   No results for input(s): HGB in the last 8760 hours.  CMP     Component Value Date/Time   NA 138 03/28/2016 2000   K 3.3 (L) 03/28/2016 2000   CL 106 03/28/2016 2000   CO2 26 03/28/2016  2000   GLUCOSE 126 (H) 03/28/2016 2000   BUN 11 03/28/2016 2000   CREATININE 0.50 03/28/2016 2000   CALCIUM 9.2 03/28/2016 2000   PROT 5.9 (L) 10/02/2015 2016   ALBUMIN 3.0 (L) 10/02/2015 2016   AST 18 10/02/2015 2016   ALT 15 10/02/2015 2016   ALKPHOS 44 10/02/2015 2016   BILITOT 0.2 (L) 10/02/2015 2016   GFRNONAA >60 03/28/2016 2000   GFRAA >60 03/28/2016 2000      Latest Ref Rng & Units 10/02/2015    8:16 PM 04/26/2013    2:43 PM 03/21/2013    6:30  AM  Hepatic Function  Total Protein 6.5 - 8.1 g/dL 5.9  6.1  5.8   Albumin 3.5 - 5.0 g/dL 3.0  3.2  2.6   AST 15 - 41 U/L 18  17  15    ALT 14 - 54 U/L 15  18  34   Alk Phosphatase 38 - 126 U/L 44  61  88   Total Bilirubin 0.3 - 1.2 mg/dL 0.2  0.4  0.2   Bilirubin, Direct 0.0 - 0.3 mg/dL   <9.8       Current Medications:   Current Outpatient Medications (Endocrine & Metabolic):    AVIANE 0.1-20 MG-MCG tablet, Take 1 tablet by mouth daily.   budesonide  (ENTOCORT EC ) 3 MG 24 hr capsule, Take 3 capsules (9 mg total) by mouth daily for 30 days, THEN 2 capsules (6 mg total) daily for 30 days, THEN 1 capsule (3 mg total) daily.   FARXIGA 10 MG TABS tablet, Take 10 mg by mouth daily.  Current Outpatient Medications (Cardiovascular):    losartan (COZAAR) 25 MG tablet, Take 50 mg by mouth daily.  Current Outpatient Medications (Respiratory):    levocetirizine (XYZAL) 5 MG tablet, 1 tablet.   promethazine (PHENERGAN) 25 MG tablet, Take 25 mg by mouth.  Current Facility-Administered Medications (Analgesics):    Galcanezumab -gnlm SOAJ 240 mg  Current Outpatient Medications (Other):    hydrOXYzine (ATARAX) 25 MG tablet, Take 25 mg by mouth every 8 (eight) hours as needed.   hyoscyamine  (ANASPAZ ) 0.125 MG TBDP disintergrating tablet, DISSOLVE 1 TABLET UNDER THE TONGUE EVERY 12 HOURS AS NEEDED FOR CRAMPING (NAUSEA/DIARRHEA)   levofloxacin (LEVAQUIN) 750 MG tablet, Take 750 mg by mouth daily.   omeprazole  (PRILOSEC) 20 MG capsule, Take 1 capsule (20 mg total) by mouth daily.   PARoxetine (PAXIL) 20 MG tablet, 1 tablet in the morning Orally Once a day   valACYclovir (VALTREX) 500 MG tablet, Take 500 mg by mouth 2 (two) times daily.   Vitamin D , Ergocalciferol , (DRISDOL ) 1.25 MG (50000 UNIT) CAPS capsule, Take 1 capsule (50,000 Units total) by mouth every 7 (seven) days.  Medical History:  Past Medical History:  Diagnosis Date   Allergy    Anemia    Anxiety    Asthma    Colon polyps     Depression    Environmental allergies    GERD (gastroesophageal reflux disease)    Glomerulonephritis, chronic membranoproliferative    Headache(784.0)    IBS (irritable bowel syndrome)    Migraines    MPGN (membranoproliferative glomerulonephritides)    Pseudocholinesterase deficiency    SVT (supraventricular tachycardia)    Allergies: Allergies[1]   Surgical History:  She  has a past surgical history that includes Renal biopsy; Tonsillectomy; Adenoidectomy; Colonoscopy; Esophagogastroduodenoscopy endoscopy; Endometrial ablation (2016); and Tubal ligation. Family History:  Her family history includes Alcohol abuse in her father and sister; Colon cancer in her  paternal uncle; Colon polyps in her father; Drug abuse in her brother and sister; Hypertension in her father and mother; Migraines in her mother and sister.  REVIEW OF SYSTEMS  : All other systems reviewed and negative except where noted in the History of Present Illness.  PHYSICAL EXAM: BP 120/80   Pulse 83   Ht 5' 5 (1.651 m)   Wt 219 lb (99.3 kg)   BMI 36.44 kg/m  GENERAL APPEARANCE: Well nourished, in no apparent distress. HEENT: No cervical lymphadenopathy, unremarkable thyroid, sclerae anicteric, conjunctiva pink. RESPIRATORY: Respiratory effort normal, breath sounds equal bilaterally without rales, rhonchi, or wheezing. CARDIO: Regular rate and rhythm with no murmurs, rubs, or gallops, peripheral pulses intact. ABDOMEN: Soft, non-distended, active bowel sounds in all four quadrants, mild discomfort on palpation, especially around the umbilicus and upper abdomen, no rebound, no mass appreciated. RECTAL: Declines. MUSCULOSKELETAL: Full range of motion, normal gait, without edema. SKIN: Dry, intact without rashes or lesions. No jaundice. NEURO: Alert, oriented, no focal deficits. PSYCH: Cooperative, normal mood and affect.  Jennifer JONELLE Coombs, PA-C 10:47 AM        [1]  Allergies Allergen Reactions   Nsaids  Other (See Comments)    Kidney disease   Ramipril  Hives    Hives and nausea   Amoxicillin Rash   Bactrim [Sulfamethoxazole-Trimethoprim] Nausea Only    nausea   Doxycycline Rash   Imitrex [Sumatriptan] Nausea And Vomiting   Maxolon [Metoclopramide] Rash   Penicillins Rash   Rizatriptan Rash   Tape Rash   "

## 2024-08-31 ENCOUNTER — Encounter: Payer: Self-pay | Admitting: Physician Assistant

## 2024-08-31 ENCOUNTER — Other Ambulatory Visit

## 2024-08-31 ENCOUNTER — Ambulatory Visit: Admitting: Physician Assistant

## 2024-08-31 VITALS — BP 120/80 | HR 83 | Ht 65.0 in | Wt 219.0 lb

## 2024-08-31 DIAGNOSIS — R140 Abdominal distension (gaseous): Secondary | ICD-10-CM

## 2024-08-31 DIAGNOSIS — K760 Fatty (change of) liver, not elsewhere classified: Secondary | ICD-10-CM

## 2024-08-31 DIAGNOSIS — K219 Gastro-esophageal reflux disease without esophagitis: Secondary | ICD-10-CM | POA: Diagnosis not present

## 2024-08-31 DIAGNOSIS — K5000 Crohn's disease of small intestine without complications: Secondary | ICD-10-CM

## 2024-08-31 DIAGNOSIS — E559 Vitamin D deficiency, unspecified: Secondary | ICD-10-CM

## 2024-08-31 DIAGNOSIS — Z111 Encounter for screening for respiratory tuberculosis: Secondary | ICD-10-CM

## 2024-08-31 DIAGNOSIS — N055 Unspecified nephritic syndrome with diffuse mesangiocapillary glomerulonephritis: Secondary | ICD-10-CM

## 2024-08-31 DIAGNOSIS — Z1159 Encounter for screening for other viral diseases: Secondary | ICD-10-CM

## 2024-08-31 LAB — CBC WITH DIFFERENTIAL/PLATELET
Basophils Absolute: 0.1 K/uL (ref 0.0–0.1)
Basophils Relative: 0.7 % (ref 0.0–3.0)
Eosinophils Absolute: 0.2 K/uL (ref 0.0–0.7)
Eosinophils Relative: 2.1 % (ref 0.0–5.0)
HCT: 41.8 % (ref 36.0–46.0)
Hemoglobin: 14 g/dL (ref 12.0–15.0)
Lymphocytes Relative: 21.5 % (ref 12.0–46.0)
Lymphs Abs: 1.8 K/uL (ref 0.7–4.0)
MCHC: 33.5 g/dL (ref 30.0–36.0)
MCV: 84 fl (ref 78.0–100.0)
Monocytes Absolute: 0.8 K/uL (ref 0.1–1.0)
Monocytes Relative: 9.5 % (ref 3.0–12.0)
Neutro Abs: 5.5 K/uL (ref 1.4–7.7)
Neutrophils Relative %: 66.2 % (ref 43.0–77.0)
Platelets: 283 K/uL (ref 150.0–400.0)
RBC: 4.97 Mil/uL (ref 3.87–5.11)
RDW: 13.9 % (ref 11.5–15.5)
WBC: 8.3 K/uL (ref 4.0–10.5)

## 2024-08-31 LAB — BASIC METABOLIC PANEL WITH GFR
BUN: 10 mg/dL (ref 6–23)
CO2: 28 meq/L (ref 19–32)
Calcium: 8.9 mg/dL (ref 8.4–10.5)
Chloride: 103 meq/L (ref 96–112)
Creatinine, Ser: 0.59 mg/dL (ref 0.40–1.20)
GFR: 121.9 mL/min
Glucose, Bld: 92 mg/dL (ref 70–99)
Potassium: 3.6 meq/L (ref 3.5–5.1)
Sodium: 137 meq/L (ref 135–145)

## 2024-08-31 LAB — HEPATIC FUNCTION PANEL
ALT: 15 U/L (ref 3–35)
AST: 18 U/L (ref 5–37)
Albumin: 3.9 g/dL (ref 3.5–5.2)
Alkaline Phosphatase: 94 U/L (ref 39–117)
Bilirubin, Direct: 0.1 mg/dL (ref 0.1–0.3)
Total Bilirubin: 0.4 mg/dL (ref 0.2–1.2)
Total Protein: 7 g/dL (ref 6.0–8.3)

## 2024-08-31 LAB — IBC + FERRITIN
Ferritin: 13.2 ng/mL (ref 10.0–291.0)
Iron: 61 ug/dL (ref 42–145)
Saturation Ratios: 17.4 % — ABNORMAL LOW (ref 20.0–50.0)
TIBC: 351.4 ug/dL (ref 250.0–450.0)
Transferrin: 251 mg/dL (ref 212.0–360.0)

## 2024-08-31 LAB — C-REACTIVE PROTEIN: CRP: <0.5 mg/dL — ABNORMAL LOW (ref 1.0–20.0)

## 2024-08-31 MED ORDER — VITAMIN D (ERGOCALCIFEROL) 1.25 MG (50000 UNIT) PO CAPS: 50000.0000 [IU] | ORAL_CAPSULE | ORAL | 0 refills | Status: AC

## 2024-08-31 NOTE — Patient Instructions (Addendum)
 Your provider has requested that you go to the basement level for lab work before leaving today. Press B on the elevator. The lab is located at the first door on the left as you exit the elevator.  Remember belching is caused by excessive air swallowing.   Samples of FDGard  Please stop: Eating or drinking too fast  Poorly fitting dentures; not chewing food completely  Carbonated beverages  Chewing gum or sucking on hard candies  Excessive swallowing due to nervous tension or postnasal drip  Forced belching to relieve abdominal discomfort To prevent excessive belching, avoid:  Carbonated beverages  Chewing gum  Hard candies   Simethicone /GasX may be helpful  Can try Florastor probiotic twice a day   Please visit these websites below for more information: [email or web address].org/ [email or web address].com.cy http://www.ibdmedicationguide.org/   Health Maintenance in Inflammatory Bowel Disease  Vaccines Inflammatory Bowel Disease (IBD) is often treated with immunomodulatory medications (6-mercaptopurine, azathioprine, methotrexate), biologic therapies (adalimumab, certulizomab, natalizumab, infliximab, vedolizumab) or chronic steroids (at least 10mg  daily for 3 months), which suppress the immune system.  Patients receiving this type of therapy are at higher risk to develop infections.  Many infections can be prevented by vaccinations. Inflammatory bowel disease within itself can cause inflammation and weaken the immune system.  Discuss vaccinations with your physician, but in general, IBD patients SHOULD receive the following vaccines:  Shingles vaccination Patient is at increased risk for immune compromised such as those with inflammatory bowel disease are able to get the Shingrix vaccine age 65 and up. 2 part shot, second shot between second and 41-month.  If you go over 6 months for the second shot have to restart series. Shingles is herpes virus dormant along the  nerve root, can cause deafness, blindness and lasting pain.  Hepatitis A and B -We can test for this and able to get this in our office.   Human papilloma virus (HPV)  The HPV vaccine is recommended in females aged 60-45. It is a 2-dose vaccine.   The HPV vaccine is also now recommended in males aged 13-45  Influenza  All adults should get the influenza vaccine annually.  IBD patients on immunosuppressive therapy should NOT get the intranasal vaccine, as it is a live vaccine.  Pneumococcal  All adults 65 years or older should receive the Pneumococcal Conjugate Vaccine (PCV13), followed by the Pneumococcal Polysaccharide Vaccine (PPSV23) at least 1 year later.  In addition, patients over the age of 46 on immunosuppressive therapy should receive the PCV13, followed by PPSV23 no sooner than 8 weeks later.  A booster PPSV23 is also recommended 5 years later.  Finally, patients that smoke should get the PPSV23 if not otherwise vaccinated.  Tetanus, diphtheria, pertussis (Tdap/Td)  All adults should receive a 1-time Tdap.  A Td booster should be administered every 10 years.   Bone Health Patients with IBD can have markedly lower bone mineral densities than patients without IBD. Low bone mineral density is associated with fractures. Therefore, screening for bone disease, such as osteoporosis and osteopenia, is important in certain populations.  The American Gastroenterology Association (AGA) and the American College of Gastroenterology [facility]) recommend dual-energy x-ray absorptiometry scanning (DEXA) in postmenopausal women, men over the age of 14, patients with prolonged corticosteroid use (greater than 3 consecutive months or recurrent courses), patients with a personal history of low trauma fracture and patients with hypogonadism.   Tobacco Cessation Smoking worsens Crohns disease. In addition, smoking is associated with many known cardiac, pulmonary and oncologic risks.  If you need help  with quitting smoking, please talk to your doctor and call 1-800-QUIT NOW.  Cancer Screening IBD patients on immunosuppressive therapy may be more susceptible to certain types of cancer. However, specific evidence is conflicting. Therefore, patients with IBD should undergo age- and sex-specific cancer screening. Please discuss this important issue with your primary care provider.  Colon Cancer  Patients with either Ulcerative Colitis (UC) or Crohns disease involving the colon should have colon cancer screening 8-10 years after initial diagnosis. After that time, the frequency of surveillance colonocoscopy will be determined by your gastroenterologist, but will usually occur every 1-2 years.   Patients with IBD and Primary Sclerosing Cholangitis (PSC) need a screening colonoscopy at the time of diagnosis of PSC and annually thereafter.    _______________________________________________________  If your blood pressure at your visit was 140/90 or greater, please contact your primary care physician to follow up on this.  _______________________________________________________  If you are age 20 or older, your body mass index should be between 23-30. Your Body mass index is 36.44 kg/m. If this is out of the aforementioned range listed, please consider follow up with your Primary Care Provider.  If you are age 22 or younger, your body mass index should be between 19-25. Your Body mass index is 36.44 kg/m. If this is out of the aformentioned range listed, please consider follow up with your Primary Care Provider.   ________________________________________________________  The [facility] providers would like to encourage you to use MYCHART to communicate with providers for non-urgent requests or questions.  Due to long hold times on the telephone, sending your provider a message by [facility] may be a faster and more efficient way to get a response.  Please allow 48 business hours for a response.   Please remember that this is for non-urgent requests.  _______________________________________________________  [facility] is using a team-based approach to care.  Your team is made up of your doctor and two to three APPS. Our APPS (Nurse Practitioners and Physician Assistants) work with your physician to ensure care continuity for you. They are fully qualified to address your health concerns and develop a treatment plan. They communicate directly with your gastroenterologist to care for you. Seeing the Advanced Practice Practitioners on your physician's team can help you by facilitating care more promptly, often allowing for earlier appointments, access to diagnostic testing, procedures, and other specialty referrals.   Thank you for trusting me with your gastrointestinal care. Alan Coombs, PA-C

## 2024-09-01 ENCOUNTER — Ambulatory Visit: Payer: Self-pay | Admitting: Physician Assistant

## 2024-09-01 LAB — HEPATITIS B SURFACE ANTIGEN: Hepatitis B Surface Ag: NONREACTIVE

## 2024-09-03 LAB — QUANTIFERON-TB GOLD PLUS
Mitogen-NIL: >10 [IU]/mL
NIL: 0.03 [IU]/mL
QuantiFERON-TB Gold Plus: NEGATIVE
TB1-NIL: 0 [IU]/mL
TB2-NIL: 0 [IU]/mL

## 2024-09-03 LAB — HEPATITIS B CORE ANTIBODY, TOTAL: Hep B Core Total Ab: NONREACTIVE

## 2024-09-03 LAB — HEPATITIS A ANTIBODY, TOTAL: Hepatitis A AB,Total: NONREACTIVE

## 2024-09-03 LAB — HEPATITIS C ANTIBODY: Hepatitis C Ab: NONREACTIVE

## 2024-09-03 LAB — SEDIMENTATION RATE: Sed Rate: 19 mm/h (ref 0–20)

## 2024-09-08 LAB — CALPROTECTIN: Calprotectin: 1690 ug/g — ABNORMAL HIGH

## 2024-09-14 NOTE — Telephone Encounter (Signed)
 Pt made aware of recommendations: Pt was scheduled to see Alan Coombs PA on 09/15/2024 at 2:30 PM. Pt made aware.  Pt verbalized understanding with all questions answered.

## 2024-09-14 NOTE — Progress Notes (Unsigned)
 "    09/15/2024 Jennifer Rangel [id number] July 28, 1996  Referring provider: Zachary Lamar BRAVO, NP Primary GI doctor: Dr. Suzann  ASSESSMENT AND PLAN:  Mild ileal Crohn's disease primarily AB pain which has improved 06/09/2024 colonoscopy Dr. Suzann 06/15/2024 budesonide  9 mg 30 days and 2 capsules 30 days and 1 capsule daily 08/2024 Fecal calprotectin 1600's, CRP, sed rate neg 08/2024 TB/Hep B negative Chronic, active Crohn's disease with significant intestinal inflammation and extraintestinal manifestations, requiring escalation of therapy, symptoms returned off of budesonide , now with AB pain, nausea, vomiting and diarrhea. She prefers IV infusions over injections or oral medications due to needle phobia and pill fatigue, declines even injections at longer intervals. Increased risk of infection and rare risk of lymphoma associated with biologic therapy were discussed. - Escalate to biologic therapy due to active disease and extraintestinal symptoms. - Review biologic options: anti-TNF agents (Remicade) , Anti integrin (Entyvio), and oral JAK inhibitor (Rinvoq). - Will discuss if Entyvio is okay to start with Dr. Suzann or if she prefers Remicaide, discussed both with the patient, all questions answered and she is willing to proceed with either medications; if not approved, proceed with Remicade (anti-TNF infusion). - Notify primary care provider regarding vaccination requirements for influenza, pneumococcal, and herpes zoster, given information - Refer to dermatology for annual skin evaluation if on biologic therapy - Restart budesonide  for short-term symptom control while awaiting insurance approval for biologic therapy. - Keep appointment with rheumatology 03/17  IDA 08/31/2024  HGB 14.0 MCV 84.0 Platelets 283.0 08/31/2024 Iron 61 Ferritin 13.2  Recent Labs    08/31/24 1057  HGB 14.0  Can not tolerate oral iron, will refer for IV iron  GERD has improved, still with  burping/gas 06/09/2024 EGD Dr. Suzann small HH negative H. pylori gastritis normal esophagus negative celiac Negative celiac, H. Pylori - stop sodas - continue PPI - consider SIBO testing or empiric treatment  Fatty liver with hemangioma without LFT elevation    Latest Ref Rng & Units 08/31/2024   10:57 AM 10/02/2015    8:16 PM 04/26/2013    2:43 PM  Hepatic Function  Total Protein 6.0 - 8.3 g/dL 7.0  5.9  6.1   Albumin 3.5 - 5.2 g/dL 3.9  3.0  3.2   AST 5 - 37 U/L 18  18  17    ALT 3 - 35 U/L 15  15  18    Alk Phosphatase 39 - 117 U/L 94  44  61   Total Bilirubin 0.2 - 1.2 mg/dL 0.4  0.2  0.4   Bilirubin, Direct 0.1 - 0.3 mg/dL 0.1     90/7974 MRI liver with and without, 10 mm hemangioma and focal fatty infiltration of left lobe 08/2024 negative hepatitis panel Suggest hep A and B vaccinations -Continue to monitor CBC/LFTs - continue to work on weight loss  Membranoproliferative glomerulonephritis  Follows with [facility], Dr. Taewoo Lee  Vitamin D  def 12/18/2023 Vitamin D  13 Will send in 49999 vitamin D  one a week for 8 weeks Recheck 04/2024  I have reviewed the clinic note as outlined by Alan Coombs, PA and agree with the assessment, plan and medical decision making.  Jennifer Rangel returns to the office for follow-up of nonstricturing, nonpenetrating ileal Crohn's disease diagnosed within the last few months.  She had beneficial response to budesonide  trial but symptoms have recurred.  Also with arthralgias.  Multiple medication options are available to her.  She adamantly declines medications that are administered as subcu agents due to  needle phobia.  Prefers IV route-will recommend infliximab 5 mg/kg standard induction followed by 5 mg/kg every 8 weeks.  She has not been able to tolerate oral iron and therefore it is reasonable to pursue IV iron infusions.  At the time of infliximab infusion can check vitamin B-12, folate, vitamin D .  Recommend therapeutic drug monitoring at week 6  infusion to help guide future dosing.  [name], MD   Patient Care Team: Zachary Lamar BRAVO, NP as PCP - General  HISTORY OF PRESENT ILLNESS: 29 y.o. female with past medical history of IBS, depression, GERD, migraines, and MPGN presents for evaluation of probable mild ileal Crohn's without complication.  I last saw the patient in the office 08/31/2024 for mild ileal crohn's disease. She was given a budesonide  taper with improvement of her symptoms some with some continuing join pain, GERD, bloating.  Her fecal calprotectin was elevated at 1600.  She presents to discuss medication options.   Current History Discussed the use of AI scribe software for clinical note transcription with the patient, who gave verbal consent to proceed.  History of Present Illness   Jennifer Rangel is a 29 year old female with Crohn's disease, chronic kidney disease, and fatty liver disease who presents for management of worsening gastrointestinal and joint symptoms.  Crohn's disease has been complicated by both intestinal and extraintestinal manifestations. Abdominal pain previously improved with budesonide  but has recurred and worsened since discontinuation. Pain is severe, occurs after eating, and is not limited to greasy foods. Bowel habits alternate between hard stools and diarrhea. Recent fecal calprotectin was markedly elevated at 1600. Colonoscopy has been performed in the past. She prefers to avoid oral prednisone due to weight gain.  Significant joint pain, especially in the legs, is severe enough to cause tears and requires use of a heating pad. Pain worsens with activity. She is awaiting evaluation by a joint specialist for her joint pain. Mouth ulcers are also present.  Chronic iron deficiency anemia is present without anemia. Oral iron is not tolerated due to severe constipation. She has previously received IV iron transfusions during pregnancy and is open to repeating this if needed. Persistent  cold intolerance and ongoing fatigue are reported.  Past flu vaccination caused an arm rash attributed to egg content, though she is not allergic to eggs. She is willing to proceed with vaccinations that are suggested after discussion. She expresses fatigue with taking pills and prefers IV infusions for new therapies. Frustration with current weight of 214 pounds is noted, which she attributes in part to Crohn's disease and medication side effects. She is interested in improving gut health and overall well-being. History of anxiety and avoidance of medical care due to fear of medications and doctors, but she is now more proactive in seeking care.    Inflammatory Bowel Disease History  Last colonoscopy:  06/09/2024 colonoscopy Dr. Hausen good prep normal mucosa rectosigmoid colon descending colon transverse colon and distal ascending colon Scattered mild inflammation proximal ascending colon and cecum Localized moderate inflammation found in ileocecal valve 5 cm of moderate inflammation found in ileum secondary to ileitis TI proximal to the inflamed segment appeared normal External/internal hemorrhoids PATH: 3. Surgical [P], small bowel, terminal ileum :  -  SMALL INTESTINAL MUCOSA WITH ARCHITECTURAL DISARRAY, MILD ACTIVITY AND FOCAL EROSION. -  NEGATIVE FOR GRANULOMAS, VIRAL CYTOPATHIC EFFECT AND DYSPLASIA. 4. Surgical [P], colon, cecum : -  1 FRAGMENT OF COLONIC MUCOSA WITH EROSION/ULCERATION AND GRANULATION TISSUE WITH ADJACENT REACTIVE/REPARATIVE CHANGE.  SEPARATE FRAGMENTS OF COLONIC MUCOSA WITH NO SIGNIFICANT PATHOLOGY. -  NEGATIVE FOR DEFINITIVE EVIDENCE OF CHRONICITY, GRANULOMAS, VIRAL CYTOPATHIC EFFECT AND DYSPLASIA.  5. Surgical [P], colon, descending :  -  COLONIC MUCOSA WITH SCATTERED PROMINENT LYMPHOID AGGREGATE, OTHERWISE NO SIGNIFICANT PATHOLOGY.  6. Surgical [P], colon, transverse :  -  COLONIC MUCOSA WITH NO SIGNIFICANT PATHOLOGY. 7. Surgical [P], colon, descending :  -   COLONIC MUCOSA WITH NO SIGNIFICANT PATHOLOGY.  8. Surgical [P], colon, recto-sigmoid :   -  COLONIC MUCOSA WITH NO SIGNIFICANT PATHOLOGY. NOTE: IT IS NOTED THAT THE PATIENT HAS A CLINICAL HISTORY OF ALTERED BOWEL HABITS WITH LEFT LOWER QUADRANT PAIN AND BLOATING.  THE ABOVE FINDINGS ARE NONSPECIFIC WITH THE 2 MAIN HISTOMORPHOLOGIC DIFFERENTIAL DIAGNOSES GIVEN THE  LOCATION OF TERMINAL ILEUM AND CECAL FINDINGS BEING BETWEEN MEDICATIONS SUCH AS  NSAIDS AND POSSIBLE INFLAMMATORY BOWEL DISEASE, BECAUSE OTHERS. CLINICAL/ENDOSCOPIC CORRELATION RECOMMENDED.  Last endoscopy:  06/09/2024 EGD Dr. Suzann small HH negative H. pylori gastritis normal esophagus negative celiac Last Abd CT/CTE/MRE:  04/03/2024 [facility] terminal ileitis 04/22/2024 MR liver with and without contrast for indeterminate liver lesions, benign hemangioma and focal fat infiltration left hepatic lobe stomach and bowel unremarkable Extraintestinal manifestations: Arthralgias and oral ulcers Surgical history: no surgery  IBD Medication History 04/02/2024 abdominal pain nausea vomiting, CT showed terminal ileitis Subsequent colonoscopy 06/09/2024 Dr. Suzann showed moderate inflammation ileocecal scattered mild inflammation ascending colon cecum with pathology showing erosions and ulcerations without chronicity potentially NSAIDs versus early mild inflammatory bowel disease.   Patient denies any NSAID use 06/06/2024 started on budesonide  taper with Dr. Suzann, continuing bloating, GERD, and arthralgias. 08/2024 Fecal calprotectin 1600's with joint pain, referred to rheum  Review of Data  The following data was reviewed at the time of this encounter:  IBD Labs   Inflammatory markers    Latest Ref Rng & Units 04/14/2024   10:33 08/31/2024   10:57 09/01/2024   09:10  Inflammatory Markers  Calprotectin mcg/g   1,690   Sed Rate 0 - 20 mm/h  19    CRP 1.0 - 20.0 mg/dL <8.9  <9.4      Prebiologic Labs    Latest Ref Rng  & Units 08/31/2024   10:57  Biologic Pre-Testing  Hepatitis B Surface Ag NON-REACTIVE NON-REACTIVE   Hepatitis C Ab NON-REACTIVE NON-REACTIVE   QuantiFERON-TB Gold Plus NEGATIVE NEGATIVE     Therapeutic Drug Monitoring     Health Maintenance:    - DEXA: if risk factors for osteoporosis - NA -Pap Smear annually if immunosuppressed - UTD -Skin check / derm eval  - Annual depression screening: continue follow up PCP - Vitamin D  screening: has def 12/18/2023, not on treatment  Vaccinations:   There is no immunization history on file for this patient. - Annual Flu Vaccine - declines, states has rash with it - Pneumococcal Vaccine (PCV 20) if receiving immunosuppression: - Date suggest getting prevnar 20      - Zoster vaccine if over age 58  RELEVANT GI HISTORY, LABS, IMAGING: CBC    Component Value Date/Time   WBC 8.3 08/31/2024 1057   RBC 4.97 08/31/2024 1057   HGB 14.0 08/31/2024 1057   HCT 41.8 08/31/2024 1057   PLT 283.0 08/31/2024 1057   MCV 84.0 08/31/2024 1057   MCH 29.0 08/12/2017 2344   MCHC 33.5 08/31/2024 1057   RDW 13.9 08/31/2024 1057   LYMPHSABS 1.8 08/31/2024 1057   MONOABS 0.8 08/31/2024 1057   EOSABS 0.2 08/31/2024  1057   BASOSABS 0.1 08/31/2024 1057   Recent Labs    08/31/24 1057  HGB 14.0    CMP     Component Value Date/Time   NA 137 08/31/2024 1057   K 3.6 08/31/2024 1057   CL 103 08/31/2024 1057   CO2 28 08/31/2024 1057   GLUCOSE 92 08/31/2024 1057   BUN 10 08/31/2024 1057   CREATININE 0.59 08/31/2024 1057   CALCIUM 8.9 08/31/2024 1057   PROT 7.0 08/31/2024 1057   ALBUMIN 3.9 08/31/2024 1057   AST 18 08/31/2024 1057   ALT 15 08/31/2024 1057   ALKPHOS 94 08/31/2024 1057   BILITOT 0.4 08/31/2024 1057   GFRNONAA >60 03/28/2016 2000   GFRAA >60 03/28/2016 2000      Latest Ref Rng & Units 08/31/2024   10:57 AM 10/02/2015    8:16 PM 04/26/2013    2:43 PM  Hepatic Function  Total Protein 6.0 - 8.3 g/dL 7.0  5.9  6.1   Albumin 3.5 - 5.2  g/dL 3.9  3.0  3.2   AST 5 - 37 U/L 18  18  17    ALT 3 - 35 U/L 15  15  18    Alk Phosphatase 39 - 117 U/L 94  44  61   Total Bilirubin 0.2 - 1.2 mg/dL 0.4  0.2  0.4   Bilirubin, Direct 0.1 - 0.3 mg/dL 0.1         Current Medications:   Current Outpatient Medications (Endocrine & Metabolic):    budesonide  (ENTOCORT EC ) 3 MG 24 hr capsule, Take 2 capsules (6 mg total) by mouth daily for 14 days, THEN 1 capsule (3 mg total) daily for 14 days.   FARXIGA 10 MG TABS tablet, Take 10 mg by mouth daily.  Current Outpatient Medications (Cardiovascular):    losartan (COZAAR) 25 MG tablet, Take 50 mg by mouth daily.  Current Outpatient Medications (Respiratory):    levocetirizine (XYZAL) 5 MG tablet, 1 tablet.   promethazine (PHENERGAN) 25 MG tablet, Take 25 mg by mouth.  Current Facility-Administered Medications (Analgesics):    Galcanezumab -gnlm SOAJ 240 mg  Current Outpatient Medications (Other):    hydrOXYzine (ATARAX) 25 MG tablet, Take 25 mg by mouth every 8 (eight) hours as needed.   hyoscyamine  (ANASPAZ ) 0.125 MG TBDP disintergrating tablet, DISSOLVE 1 TABLET UNDER THE TONGUE EVERY 12 HOURS AS NEEDED FOR CRAMPING (NAUSEA/DIARRHEA)   omeprazole  (PRILOSEC) 20 MG capsule, Take 1 capsule (20 mg total) by mouth daily.   PARoxetine (PAXIL) 20 MG tablet, 1 tablet in the morning Orally Once a day   valACYclovir (VALTREX) 500 MG tablet, Take 500 mg by mouth 2 (two) times daily.   Vitamin D , Ergocalciferol , (DRISDOL ) 1.25 MG (50000 UNIT) CAPS capsule, Take 1 capsule (50,000 Units total) by mouth every 7 (seven) days.  Medical History:  Past Medical History:  Diagnosis Date   Allergy    Anemia    Anxiety    Asthma    Colon polyps    Depression    Environmental allergies    GERD (gastroesophageal reflux disease)    Glomerulonephritis, chronic membranoproliferative    Headache(784.0)    IBS (irritable bowel syndrome)    Migraines    MPGN (membranoproliferative glomerulonephritides)     Pseudocholinesterase deficiency    SVT (supraventricular tachycardia)    Allergies: Allergies[1]   Surgical History:  She  has a past surgical history that includes Renal biopsy; Tonsillectomy; Adenoidectomy; Colonoscopy; Esophagogastroduodenoscopy endoscopy; Endometrial ablation (2016); and Tubal ligation. Family History:  Her family history includes Alcohol abuse in her father and sister; Autism in her son and son; Colon cancer in her paternal uncle; Colon polyps in her father; Drug abuse in her brother and sister; Hypertension in her father and mother; Migraines in her mother and sister; SIDS in her son.  REVIEW OF SYSTEMS  : All other systems reviewed and negative except where noted in the History of Present Illness.  PHYSICAL EXAM: BP 104/78 (BP Location: Left Arm, Patient Position: Sitting, Cuff Size: Normal)   Pulse 92   Ht 5' 4 (1.626 m) Comment: height measured without shoes  Wt 214 lb 4 oz (97.2 kg)   LMP 09/10/2024   Breastfeeding No   BMI 36.78 kg/m  GENERAL APPEARANCE: Well nourished, in no apparent distress. HEENT: No cervical lymphadenopathy, unremarkable thyroid, sclerae anicteric, conjunctiva pink. RESPIRATORY: Respiratory effort normal, breath sounds equal bilaterally without rales, rhonchi, or wheezing. CARDIO: Regular rate and rhythm with no murmurs, rubs, or gallops, peripheral pulses intact. ABDOMEN: Soft, non-distended, active bowel sounds in all four quadrants, mild discomfort on palpation, especially around the umbilicus and upper abdomen, no rebound, no mass appreciated. RECTAL: Declines. MUSCULOSKELETAL: Full range of motion, normal gait, without edema. SKIN: Dry, intact without rashes or lesions. No jaundice. NEURO: Alert, oriented, no focal deficits. PSYCH: Cooperative, normal mood and affect.  Alan JONELLE Coombs, PA-C 3:13 PM        [1]  Allergies Allergen Reactions   Nsaids Other (See Comments)    Kidney disease   Ramipril  Hives    Hives and  nausea   Amoxicillin Rash   Bactrim [Sulfamethoxazole-Trimethoprim] Nausea Only    nausea   Doxycycline Rash   Imitrex [Sumatriptan] Nausea And Vomiting   Maxolon [Metoclopramide] Rash   Penicillins Rash   Rizatriptan Rash   Tape Rash   "

## 2024-09-15 ENCOUNTER — Ambulatory Visit: Admitting: Physician Assistant

## 2024-09-15 ENCOUNTER — Encounter: Payer: Self-pay | Admitting: Physician Assistant

## 2024-09-15 VITALS — BP 104/78 | HR 92 | Ht 64.0 in | Wt 214.2 lb

## 2024-09-15 DIAGNOSIS — K760 Fatty (change of) liver, not elsewhere classified: Secondary | ICD-10-CM | POA: Diagnosis not present

## 2024-09-15 DIAGNOSIS — E559 Vitamin D deficiency, unspecified: Secondary | ICD-10-CM

## 2024-09-15 DIAGNOSIS — E538 Deficiency of other specified B group vitamins: Secondary | ICD-10-CM

## 2024-09-15 DIAGNOSIS — K5000 Crohn's disease of small intestine without complications: Secondary | ICD-10-CM

## 2024-09-15 DIAGNOSIS — R109 Unspecified abdominal pain: Secondary | ICD-10-CM

## 2024-09-15 DIAGNOSIS — Z79899 Other long term (current) drug therapy: Secondary | ICD-10-CM

## 2024-09-15 DIAGNOSIS — D509 Iron deficiency anemia, unspecified: Secondary | ICD-10-CM

## 2024-09-15 DIAGNOSIS — K219 Gastro-esophageal reflux disease without esophagitis: Secondary | ICD-10-CM

## 2024-09-15 DIAGNOSIS — R112 Nausea with vomiting, unspecified: Secondary | ICD-10-CM | POA: Diagnosis not present

## 2024-09-15 DIAGNOSIS — R140 Abdominal distension (gaseous): Secondary | ICD-10-CM

## 2024-09-15 DIAGNOSIS — D500 Iron deficiency anemia secondary to blood loss (chronic): Secondary | ICD-10-CM

## 2024-09-15 DIAGNOSIS — N055 Unspecified nephritic syndrome with diffuse mesangiocapillary glomerulonephritis: Secondary | ICD-10-CM

## 2024-09-15 MED ORDER — BUDESONIDE 3 MG PO CPEP: ORAL_CAPSULE | ORAL | 0 refills | Status: AC

## 2024-09-15 NOTE — Patient Instructions (Addendum)
 VISIT SUMMARY:  During your visit, we discussed the management of your worsening gastrointestinal and joint symptoms related to Crohn's disease, as well as your chronic iron deficiency anemia. We reviewed potential new treatments and planned next steps to address your symptoms and improve your overall well-being.  YOUR PLAN:  CROHN'S DISEASE: You have active Crohn's disease with significant intestinal inflammation and extraintestinal symptoms, including joint pain and mouth ulcers. -We will escalate your treatment to biologic therapy due to the active disease and extraintestinal symptoms. -We discussed biologic options: anti-TNF agents (Remicade, IV infusion , IL-23 inhibitor (Omvoh, IV infusion , and oral JAK inhibitor (Rinvoq). -We will pursue insurance approval for Omvoh (IL-23 inhibitor infusion). If not approved, we will proceed with Remicade (anti-TNF infusion). -We will notify your primary care provider regarding vaccination requirements for influenza, pneumococcal, and herpes zoster prior to starting biologic therapy. -We will refer you to dermatology for an annual skin evaluation if you start biologic therapy  -We will restart budesonide  for short-term symptom control while awaiting insurance approval for biologic therapy.  IRON DEFICIENCY ANEMIA: You have chronic iron deficiency anemia with persistent symptoms, and oral iron is not tolerated due to severe constipation. -We will arrange for an IV iron infusion as you previously received during pregnancy.  Please visit these websites below for more information: [email or web address]   Health Maintenance in Inflammatory Bowel Disease  Vaccines Inflammatory Bowel Disease (IBD) is often treated with immunomodulatory medications (6-mercaptopurine, azathioprine, methotrexate), biologic therapies (adalimumab, certulizomab, natalizumab, infliximab, vedolizumab) or  chronic steroids (at least 10mg  daily for 3 months), which suppress the immune system.  Patients receiving this type of therapy are at higher risk to develop infections.  Many infections can be prevented by vaccinations. Inflammatory bowel disease within itself can cause inflammation and weaken the immune system.  Discuss vaccinations with your physician, but in general, IBD patients SHOULD receive the following vaccines:  Shingles vaccination Patient is at increased risk for immune compromised such as those with inflammatory bowel disease are able to get the Shingrix vaccine age 43 and up. 2 part shot, second shot between second and 43-month.  If you go over 6 months for the second shot have to restart series. Shingles is herpes virus dormant along the nerve root, can cause deafness, blindness and lasting pain.  Hepatitis A and B -suggest getting vaccination  Influenza  All adults should get the influenza vaccine annually.  IBD patients on immunosuppressive therapy should NOT get the intranasal vaccine, as it is a live vaccine.  Pneumococcal  All adults 65 years or older should receive the Pneumococcal Conjugate Vaccine (PCV13), followed by the Pneumococcal Polysaccharide Vaccine (PPSV23) at least 1 year later.  In addition, patients over the age of 30 on immunosuppressive therapy should receive the PCV13, followed by PPSV23 no sooner than 8 weeks later.  A booster PPSV23 is also recommended 5 years later.  Finally, patients that smoke should get the PPSV23 if not otherwise vaccinated.  Tetanus, diphtheria, pertussis (Tdap/Td)  All adults should receive a 1-time Tdap.  A Td booster should be administered every 10 years.   Bone Health Patients with IBD can have markedly lower bone mineral densities than patients without IBD. Low bone mineral density is associated with fractures. Therefore, screening for bone disease, such as osteoporosis and osteopenia, is important in certain  populations.  The American Gastroenterology Association (AGA) and the American College of Gastroenterology [facility]) recommend dual-energy x-ray absorptiometry scanning (DEXA) in postmenopausal women, men over the age  of 50, patients with prolonged corticosteroid use (greater than 3 consecutive months or recurrent courses), patients with a personal history of low trauma fracture and patients with hypogonadism.   Tobacco Cessation Smoking worsens Crohns disease. In addition, smoking is associated with many known cardiac, pulmonary and oncologic risks.   If you need help with quitting smoking, please talk to your doctor and call 1-800-QUIT NOW.  Cancer Screening IBD patients on immunosuppressive therapy may be more susceptible to certain types of cancer. However, specific evidence is conflicting. Therefore, patients with IBD should undergo age- and sex-specific cancer screening. Please discuss this important issue with your primary care provider  I appreciate the  opportunity to care for you  Thank You   Amanda Collier,PA-C

## 2024-09-16 ENCOUNTER — Other Ambulatory Visit (HOSPITAL_COMMUNITY): Payer: Self-pay | Admitting: Physician Assistant

## 2024-09-16 ENCOUNTER — Encounter (HOSPITAL_COMMUNITY): Payer: Self-pay | Admitting: Pharmacist

## 2024-09-16 ENCOUNTER — Encounter (HOSPITAL_COMMUNITY): Payer: Self-pay | Admitting: Physician Assistant

## 2024-09-16 ENCOUNTER — Other Ambulatory Visit: Payer: Self-pay

## 2024-09-16 ENCOUNTER — Telehealth: Payer: Self-pay

## 2024-09-16 DIAGNOSIS — K5000 Crohn's disease of small intestine without complications: Secondary | ICD-10-CM | POA: Insufficient documentation

## 2024-09-16 DIAGNOSIS — D509 Iron deficiency anemia, unspecified: Secondary | ICD-10-CM | POA: Insufficient documentation

## 2024-09-16 NOTE — Addendum Note (Signed)
 Addended by: CRAIG PALMA on: 09/16/2024 08:29 AM   Modules accepted: Orders

## 2024-09-16 NOTE — Telephone Encounter (Signed)
-----   Message from Alan JONELLE Coombs sent at 09/16/2024  8:28 AM EST ----- Please set up labs prior to week 6 infusion that are in epic. Can do B12, folate, vitamin D , and infliximab antibody/titer. Can they do that at infusion center? Thanks

## 2024-09-16 NOTE — Telephone Encounter (Signed)
 Lab orders already in epic & staff reminder sent to POD B RN to remind patient closer to date when she needs labs.

## 2024-09-20 ENCOUNTER — Ambulatory Visit

## 2024-09-22 ENCOUNTER — Ambulatory Visit

## 2024-09-22 ENCOUNTER — Telehealth: Payer: Self-pay | Admitting: Pharmacy Technician

## 2024-09-22 VITALS — BP 115/89 | HR 81 | Temp 98.3°F | Resp 16

## 2024-09-22 DIAGNOSIS — D500 Iron deficiency anemia secondary to blood loss (chronic): Secondary | ICD-10-CM

## 2024-09-22 MED ORDER — SODIUM CHLORIDE 0.9 % IV SOLN
510.0000 mg | Freq: Once | INTRAVENOUS | Status: AC
  Administered 2024-09-22: 510 mg via INTRAVENOUS

## 2024-09-22 NOTE — Progress Notes (Signed)
 Diagnosis: Iron Deficiency Anemia  Provider:  Sherre Clapper MD  Procedure: IV Infusion  IV Type: Peripheral, IV Location: R Antecubital  Feraheme (Ferumoxytol ), Dose: 510 mg  Infusion Start Time: 0846  Infusion Stop Time: 0904  Post Infusion IV Care: Observation period completed and Peripheral IV Discontinued  Discharge: Condition: Good, Destination: Home . AVS Declined  Performed by:  Waddell LOISE Freshwater, RN

## 2024-09-22 NOTE — Telephone Encounter (Addendum)
 Auth Submission: NO AUTH NEEDED Site of care: [place] Payer: [employer] Medication & CPT/J Code(s) submitted: Feraheme (ferumoxytol ) [id number] Diagnosis Code: D50.0 Route of submission (phone, fax, portal):  Phone # Fax # Auth type: Buy/Bill PB Units/visits requested: 2 DOSES Reference number:  Approval from: 09/23/23 to 02/14/25

## 2024-09-22 NOTE — Telephone Encounter (Addendum)
 Auth Submission: NO AUTH NEEDED PATIENT WILL BE SCHEDULED AS SOON AS POSSIBLE Site of care: [place] Payer: [employer] - MEDIC[employer]AD Medication & CPT/J Code(s) submitted: Avsola (infliximab-axxq) [id number] Diagnosis Code: K50.0 Route of submission (phone, fax, portal):  Phone # Fax # Auth type: Buy/Bill PB Units/visits requested: 5MG /KG - 500MG  DAY0, DAY14, DAY42, THEN Q8WKS Reference number: [id number] Approval from: 09/16/24 to 08/16/25

## 2024-09-27 ENCOUNTER — Ambulatory Visit

## 2024-09-29 ENCOUNTER — Ambulatory Visit

## 2024-11-01 ENCOUNTER — Ambulatory Visit: Admitting: Physician Assistant

## 2024-11-03 ENCOUNTER — Ambulatory Visit: Admitting: Pediatrics
